# Patient Record
Sex: Female | Born: 1935
Health system: Southern US, Community
[De-identification: ages and names within clinical notes are randomized; demographics above are authoritative.]

## PROBLEM LIST (undated history)

## (undated) DIAGNOSIS — C4492 Squamous cell carcinoma of skin, unspecified: Secondary | ICD-10-CM

## (undated) DIAGNOSIS — C801 Malignant (primary) neoplasm, unspecified: Secondary | ICD-10-CM

## (undated) DIAGNOSIS — C4491 Basal cell carcinoma of skin, unspecified: Secondary | ICD-10-CM

## (undated) DIAGNOSIS — M1990 Unspecified osteoarthritis, unspecified site: Secondary | ICD-10-CM

## (undated) DIAGNOSIS — N809 Endometriosis, unspecified: Secondary | ICD-10-CM

## (undated) DIAGNOSIS — K649 Unspecified hemorrhoids: Secondary | ICD-10-CM

## (undated) DIAGNOSIS — G43909 Migraine, unspecified, not intractable, without status migrainosus: Secondary | ICD-10-CM

## (undated) HISTORY — DX: Basal cell carcinoma of skin, unspecified: C44.91

## (undated) HISTORY — PX: BREAST SURGERY: SHX581

## (undated) HISTORY — PX: LUNG SURGERY: SHX703

## (undated) HISTORY — PX: HEMORRHOIDECTOMY WITH HEMORRHOID BANDING: SHX5633

## (undated) HISTORY — PX: ABDOMINAL HYSTERECTOMY: SHX81

## (undated) HISTORY — DX: Squamous cell carcinoma of skin, unspecified: C44.92

## (undated) HISTORY — PX: JOINT REPLACEMENT: SHX530

## (undated) HISTORY — DX: Endometriosis, unspecified: N80.9

## (undated) HISTORY — PX: OTHER SURGICAL HISTORY: SHX169

---

## 2005-01-12 ENCOUNTER — Ambulatory Visit: Payer: Self-pay | Admitting: Neurology

## 2005-03-29 ENCOUNTER — Ambulatory Visit: Payer: Self-pay | Admitting: Family Medicine

## 2005-10-18 ENCOUNTER — Ambulatory Visit: Payer: Self-pay | Admitting: Neurology

## 2006-09-04 ENCOUNTER — Ambulatory Visit: Payer: Self-pay | Admitting: Gastroenterology

## 2006-09-04 LAB — HM COLONOSCOPY

## 2006-10-09 HISTORY — PX: OTHER SURGICAL HISTORY: SHX169

## 2007-11-20 ENCOUNTER — Other Ambulatory Visit: Payer: Self-pay

## 2007-11-20 ENCOUNTER — Ambulatory Visit: Payer: Self-pay | Admitting: Urology

## 2007-12-11 ENCOUNTER — Ambulatory Visit: Payer: Self-pay | Admitting: Urology

## 2008-01-21 ENCOUNTER — Ambulatory Visit: Payer: Self-pay | Admitting: Neurology

## 2009-03-15 ENCOUNTER — Ambulatory Visit: Payer: Self-pay | Admitting: General Practice

## 2009-03-23 ENCOUNTER — Inpatient Hospital Stay: Payer: Self-pay | Admitting: General Practice

## 2009-09-30 DIAGNOSIS — M8580 Other specified disorders of bone density and structure, unspecified site: Secondary | ICD-10-CM | POA: Insufficient documentation

## 2010-08-04 ENCOUNTER — Ambulatory Visit: Payer: Self-pay

## 2010-12-12 ENCOUNTER — Ambulatory Visit: Payer: Self-pay

## 2011-01-30 ENCOUNTER — Ambulatory Visit: Payer: Self-pay | Admitting: Neurology

## 2011-07-30 ENCOUNTER — Ambulatory Visit: Payer: Self-pay | Admitting: General Practice

## 2011-10-24 DIAGNOSIS — D497 Neoplasm of unspecified behavior of endocrine glands and other parts of nervous system: Secondary | ICD-10-CM | POA: Diagnosis not present

## 2011-10-24 DIAGNOSIS — M4802 Spinal stenosis, cervical region: Secondary | ICD-10-CM | POA: Diagnosis not present

## 2011-12-26 DIAGNOSIS — M169 Osteoarthritis of hip, unspecified: Secondary | ICD-10-CM | POA: Diagnosis not present

## 2012-02-02 DIAGNOSIS — M5137 Other intervertebral disc degeneration, lumbosacral region: Secondary | ICD-10-CM | POA: Diagnosis not present

## 2012-02-02 DIAGNOSIS — IMO0002 Reserved for concepts with insufficient information to code with codable children: Secondary | ICD-10-CM | POA: Diagnosis not present

## 2012-02-12 DIAGNOSIS — Z23 Encounter for immunization: Secondary | ICD-10-CM | POA: Diagnosis not present

## 2012-02-12 DIAGNOSIS — E038 Other specified hypothyroidism: Secondary | ICD-10-CM | POA: Diagnosis not present

## 2012-02-12 DIAGNOSIS — R209 Unspecified disturbances of skin sensation: Secondary | ICD-10-CM | POA: Diagnosis not present

## 2012-03-06 DIAGNOSIS — Z85820 Personal history of malignant melanoma of skin: Secondary | ICD-10-CM | POA: Diagnosis not present

## 2012-03-06 DIAGNOSIS — D2320 Other benign neoplasm of skin of unspecified ear and external auricular canal: Secondary | ICD-10-CM | POA: Diagnosis not present

## 2012-03-06 DIAGNOSIS — D485 Neoplasm of uncertain behavior of skin: Secondary | ICD-10-CM | POA: Diagnosis not present

## 2012-03-12 ENCOUNTER — Ambulatory Visit: Payer: Self-pay | Admitting: General Practice

## 2012-03-12 DIAGNOSIS — M169 Osteoarthritis of hip, unspecified: Secondary | ICD-10-CM | POA: Diagnosis not present

## 2012-03-12 DIAGNOSIS — I499 Cardiac arrhythmia, unspecified: Secondary | ICD-10-CM | POA: Diagnosis not present

## 2012-03-12 DIAGNOSIS — I2510 Atherosclerotic heart disease of native coronary artery without angina pectoris: Secondary | ICD-10-CM | POA: Diagnosis not present

## 2012-03-12 DIAGNOSIS — Z90710 Acquired absence of both cervix and uterus: Secondary | ICD-10-CM | POA: Diagnosis not present

## 2012-03-12 DIAGNOSIS — Z888 Allergy status to other drugs, medicaments and biological substances status: Secondary | ICD-10-CM | POA: Diagnosis not present

## 2012-03-12 DIAGNOSIS — Z8619 Personal history of other infectious and parasitic diseases: Secondary | ICD-10-CM | POA: Diagnosis not present

## 2012-03-12 DIAGNOSIS — Z01812 Encounter for preprocedural laboratory examination: Secondary | ICD-10-CM | POA: Diagnosis not present

## 2012-03-12 DIAGNOSIS — M79609 Pain in unspecified limb: Secondary | ICD-10-CM | POA: Diagnosis not present

## 2012-03-12 DIAGNOSIS — G43909 Migraine, unspecified, not intractable, without status migrainosus: Secondary | ICD-10-CM | POA: Diagnosis not present

## 2012-03-12 DIAGNOSIS — Z79899 Other long term (current) drug therapy: Secondary | ICD-10-CM | POA: Diagnosis not present

## 2012-03-12 DIAGNOSIS — Z01810 Encounter for preprocedural cardiovascular examination: Secondary | ICD-10-CM | POA: Diagnosis not present

## 2012-03-12 LAB — URINALYSIS, COMPLETE
Bilirubin,UR: NEGATIVE
Glucose,UR: NEGATIVE mg/dL (ref 0–75)
Nitrite: NEGATIVE
Ph: 6 (ref 4.5–8.0)
Protein: NEGATIVE
RBC,UR: <1 /HPF (ref 0–5)
Transitional Epi: <1

## 2012-03-12 LAB — BASIC METABOLIC PANEL
Anion Gap: 5 — ABNORMAL LOW (ref 7–16)
BUN: 12 mg/dL (ref 7–18)
Calcium, Total: 9.9 mg/dL (ref 8.5–10.1)
Co2: 31 mmol/L (ref 21–32)
Creatinine: 0.7 mg/dL (ref 0.60–1.30)
EGFR (African American): >60
EGFR (Non-African Amer.): >60
Glucose: 83 mg/dL (ref 65–99)
Osmolality: 269 (ref 275–301)
Potassium: 4.3 mmol/L (ref 3.5–5.1)
Sodium: 135 mmol/L — ABNORMAL LOW (ref 136–145)

## 2012-03-12 LAB — CBC
HGB: 12.3 g/dL (ref 12.0–16.0)
Platelet: 273 10*3/uL (ref 150–440)
RBC: 4.39 10*6/uL (ref 3.80–5.20)
WBC: 7.8 10*3/uL (ref 3.6–11.0)

## 2012-03-12 LAB — PROTIME-INR: Prothrombin Time: 12.9 secs (ref 11.5–14.7)

## 2012-03-12 LAB — SEDIMENTATION RATE: Erythrocyte Sed Rate: 11 mm/hr (ref 0–30)

## 2012-03-12 LAB — APTT: Activated PTT: 30.4 secs (ref 23.6–35.9)

## 2012-03-25 ENCOUNTER — Inpatient Hospital Stay: Payer: Self-pay | Admitting: General Practice

## 2012-03-25 DIAGNOSIS — Z90710 Acquired absence of both cervix and uterus: Secondary | ICD-10-CM | POA: Diagnosis not present

## 2012-03-25 DIAGNOSIS — Z8042 Family history of malignant neoplasm of prostate: Secondary | ICD-10-CM | POA: Diagnosis not present

## 2012-03-25 DIAGNOSIS — Z803 Family history of malignant neoplasm of breast: Secondary | ICD-10-CM | POA: Diagnosis not present

## 2012-03-25 DIAGNOSIS — I2510 Atherosclerotic heart disease of native coronary artery without angina pectoris: Secondary | ICD-10-CM | POA: Diagnosis present

## 2012-03-25 DIAGNOSIS — G43909 Migraine, unspecified, not intractable, without status migrainosus: Secondary | ICD-10-CM | POA: Diagnosis present

## 2012-03-25 DIAGNOSIS — Z9010 Acquired absence of unspecified breast and nipple: Secondary | ICD-10-CM | POA: Diagnosis not present

## 2012-03-25 DIAGNOSIS — Z85820 Personal history of malignant melanoma of skin: Secondary | ICD-10-CM | POA: Diagnosis not present

## 2012-03-25 DIAGNOSIS — Z8249 Family history of ischemic heart disease and other diseases of the circulatory system: Secondary | ICD-10-CM | POA: Diagnosis not present

## 2012-03-25 DIAGNOSIS — M1990 Unspecified osteoarthritis, unspecified site: Secondary | ICD-10-CM | POA: Diagnosis not present

## 2012-03-25 DIAGNOSIS — R011 Cardiac murmur, unspecified: Secondary | ICD-10-CM | POA: Diagnosis present

## 2012-03-25 DIAGNOSIS — M129 Arthropathy, unspecified: Secondary | ICD-10-CM | POA: Diagnosis present

## 2012-03-25 DIAGNOSIS — N6489 Other specified disorders of breast: Secondary | ICD-10-CM | POA: Diagnosis present

## 2012-03-25 DIAGNOSIS — K648 Other hemorrhoids: Secondary | ICD-10-CM | POA: Diagnosis present

## 2012-03-25 DIAGNOSIS — J42 Unspecified chronic bronchitis: Secondary | ICD-10-CM | POA: Diagnosis present

## 2012-03-25 DIAGNOSIS — Z79899 Other long term (current) drug therapy: Secondary | ICD-10-CM | POA: Diagnosis not present

## 2012-03-25 DIAGNOSIS — Z9889 Other specified postprocedural states: Secondary | ICD-10-CM | POA: Diagnosis not present

## 2012-03-25 DIAGNOSIS — Z8262 Family history of osteoporosis: Secondary | ICD-10-CM | POA: Diagnosis not present

## 2012-03-25 DIAGNOSIS — Z96649 Presence of unspecified artificial hip joint: Secondary | ICD-10-CM | POA: Diagnosis not present

## 2012-03-25 DIAGNOSIS — Z471 Aftercare following joint replacement surgery: Secondary | ICD-10-CM | POA: Diagnosis not present

## 2012-03-25 DIAGNOSIS — Z8619 Personal history of other infectious and parasitic diseases: Secondary | ICD-10-CM | POA: Diagnosis not present

## 2012-03-25 DIAGNOSIS — M169 Osteoarthritis of hip, unspecified: Secondary | ICD-10-CM | POA: Diagnosis not present

## 2012-03-25 DIAGNOSIS — Z560 Unemployment, unspecified: Secondary | ICD-10-CM | POA: Diagnosis not present

## 2012-03-25 DIAGNOSIS — Z96659 Presence of unspecified artificial knee joint: Secondary | ICD-10-CM | POA: Diagnosis not present

## 2012-03-25 DIAGNOSIS — Z7982 Long term (current) use of aspirin: Secondary | ICD-10-CM | POA: Diagnosis not present

## 2012-03-25 DIAGNOSIS — Z888 Allergy status to other drugs, medicaments and biological substances status: Secondary | ICD-10-CM | POA: Diagnosis not present

## 2012-03-26 LAB — HEMOGLOBIN: HGB: 8.9 g/dL — ABNORMAL LOW (ref 12.0–16.0)

## 2012-03-26 LAB — BASIC METABOLIC PANEL
Anion Gap: 8 (ref 7–16)
BUN: 8 mg/dL (ref 7–18)
Creatinine: 0.7 mg/dL (ref 0.60–1.30)
EGFR (African American): >60
Glucose: 89 mg/dL (ref 65–99)
Osmolality: 273 (ref 275–301)
Potassium: 4.3 mmol/L (ref 3.5–5.1)

## 2012-03-26 LAB — PLATELET COUNT: Platelet: 195 10*3/uL (ref 150–440)

## 2012-03-27 LAB — PATHOLOGY REPORT

## 2012-03-27 LAB — BASIC METABOLIC PANEL
Calcium, Total: 8.3 mg/dL — ABNORMAL LOW (ref 8.5–10.1)
Chloride: 99 mmol/L (ref 98–107)
Co2: 26 mmol/L (ref 21–32)
Creatinine: 0.5 mg/dL — ABNORMAL LOW (ref 0.60–1.30)
EGFR (African American): >60
EGFR (Non-African Amer.): >60
Osmolality: 265 (ref 275–301)
Sodium: 134 mmol/L — ABNORMAL LOW (ref 136–145)

## 2012-03-29 DIAGNOSIS — Z96649 Presence of unspecified artificial hip joint: Secondary | ICD-10-CM | POA: Diagnosis not present

## 2012-03-29 DIAGNOSIS — Z471 Aftercare following joint replacement surgery: Secondary | ICD-10-CM | POA: Diagnosis not present

## 2012-03-29 DIAGNOSIS — IMO0001 Reserved for inherently not codable concepts without codable children: Secondary | ICD-10-CM | POA: Diagnosis not present

## 2012-03-29 DIAGNOSIS — Z85820 Personal history of malignant melanoma of skin: Secondary | ICD-10-CM | POA: Diagnosis not present

## 2012-03-29 DIAGNOSIS — M545 Low back pain: Secondary | ICD-10-CM | POA: Diagnosis not present

## 2012-03-30 DIAGNOSIS — Z85820 Personal history of malignant melanoma of skin: Secondary | ICD-10-CM | POA: Diagnosis not present

## 2012-03-30 DIAGNOSIS — M545 Low back pain: Secondary | ICD-10-CM | POA: Diagnosis not present

## 2012-03-30 DIAGNOSIS — IMO0001 Reserved for inherently not codable concepts without codable children: Secondary | ICD-10-CM | POA: Diagnosis not present

## 2012-03-30 DIAGNOSIS — Z96649 Presence of unspecified artificial hip joint: Secondary | ICD-10-CM | POA: Diagnosis not present

## 2012-03-30 DIAGNOSIS — Z471 Aftercare following joint replacement surgery: Secondary | ICD-10-CM | POA: Diagnosis not present

## 2012-04-02 DIAGNOSIS — Z471 Aftercare following joint replacement surgery: Secondary | ICD-10-CM | POA: Diagnosis not present

## 2012-04-02 DIAGNOSIS — Z85820 Personal history of malignant melanoma of skin: Secondary | ICD-10-CM | POA: Diagnosis not present

## 2012-04-02 DIAGNOSIS — M545 Low back pain: Secondary | ICD-10-CM | POA: Diagnosis not present

## 2012-04-02 DIAGNOSIS — Z96649 Presence of unspecified artificial hip joint: Secondary | ICD-10-CM | POA: Diagnosis not present

## 2012-04-02 DIAGNOSIS — IMO0001 Reserved for inherently not codable concepts without codable children: Secondary | ICD-10-CM | POA: Diagnosis not present

## 2012-04-04 DIAGNOSIS — Z471 Aftercare following joint replacement surgery: Secondary | ICD-10-CM | POA: Diagnosis not present

## 2012-04-04 DIAGNOSIS — IMO0001 Reserved for inherently not codable concepts without codable children: Secondary | ICD-10-CM | POA: Diagnosis not present

## 2012-04-04 DIAGNOSIS — Z96649 Presence of unspecified artificial hip joint: Secondary | ICD-10-CM | POA: Diagnosis not present

## 2012-04-04 DIAGNOSIS — M545 Low back pain: Secondary | ICD-10-CM | POA: Diagnosis not present

## 2012-04-04 DIAGNOSIS — Z85820 Personal history of malignant melanoma of skin: Secondary | ICD-10-CM | POA: Diagnosis not present

## 2012-04-08 DIAGNOSIS — Z85820 Personal history of malignant melanoma of skin: Secondary | ICD-10-CM | POA: Diagnosis not present

## 2012-04-08 DIAGNOSIS — Z471 Aftercare following joint replacement surgery: Secondary | ICD-10-CM | POA: Diagnosis not present

## 2012-04-08 DIAGNOSIS — Z96649 Presence of unspecified artificial hip joint: Secondary | ICD-10-CM | POA: Diagnosis not present

## 2012-04-08 DIAGNOSIS — IMO0001 Reserved for inherently not codable concepts without codable children: Secondary | ICD-10-CM | POA: Diagnosis not present

## 2012-04-08 DIAGNOSIS — M545 Low back pain: Secondary | ICD-10-CM | POA: Diagnosis not present

## 2012-04-10 DIAGNOSIS — M545 Low back pain: Secondary | ICD-10-CM | POA: Diagnosis not present

## 2012-04-10 DIAGNOSIS — Z85820 Personal history of malignant melanoma of skin: Secondary | ICD-10-CM | POA: Diagnosis not present

## 2012-04-10 DIAGNOSIS — Z96649 Presence of unspecified artificial hip joint: Secondary | ICD-10-CM | POA: Diagnosis not present

## 2012-04-10 DIAGNOSIS — IMO0001 Reserved for inherently not codable concepts without codable children: Secondary | ICD-10-CM | POA: Diagnosis not present

## 2012-04-10 DIAGNOSIS — Z471 Aftercare following joint replacement surgery: Secondary | ICD-10-CM | POA: Diagnosis not present

## 2012-04-15 DIAGNOSIS — Z85820 Personal history of malignant melanoma of skin: Secondary | ICD-10-CM | POA: Diagnosis not present

## 2012-04-15 DIAGNOSIS — Z96649 Presence of unspecified artificial hip joint: Secondary | ICD-10-CM | POA: Diagnosis not present

## 2012-04-15 DIAGNOSIS — Z471 Aftercare following joint replacement surgery: Secondary | ICD-10-CM | POA: Diagnosis not present

## 2012-04-15 DIAGNOSIS — IMO0001 Reserved for inherently not codable concepts without codable children: Secondary | ICD-10-CM | POA: Diagnosis not present

## 2012-04-15 DIAGNOSIS — M545 Low back pain: Secondary | ICD-10-CM | POA: Diagnosis not present

## 2012-04-19 DIAGNOSIS — IMO0001 Reserved for inherently not codable concepts without codable children: Secondary | ICD-10-CM | POA: Diagnosis not present

## 2012-04-19 DIAGNOSIS — Z96649 Presence of unspecified artificial hip joint: Secondary | ICD-10-CM | POA: Diagnosis not present

## 2012-04-19 DIAGNOSIS — Z85820 Personal history of malignant melanoma of skin: Secondary | ICD-10-CM | POA: Diagnosis not present

## 2012-04-19 DIAGNOSIS — M545 Low back pain: Secondary | ICD-10-CM | POA: Diagnosis not present

## 2012-04-19 DIAGNOSIS — Z471 Aftercare following joint replacement surgery: Secondary | ICD-10-CM | POA: Diagnosis not present

## 2012-04-21 DIAGNOSIS — Z96649 Presence of unspecified artificial hip joint: Secondary | ICD-10-CM | POA: Diagnosis not present

## 2012-04-21 DIAGNOSIS — M545 Low back pain: Secondary | ICD-10-CM | POA: Diagnosis not present

## 2012-04-21 DIAGNOSIS — Z85820 Personal history of malignant melanoma of skin: Secondary | ICD-10-CM | POA: Diagnosis not present

## 2012-04-21 DIAGNOSIS — IMO0001 Reserved for inherently not codable concepts without codable children: Secondary | ICD-10-CM | POA: Diagnosis not present

## 2012-04-21 DIAGNOSIS — Z471 Aftercare following joint replacement surgery: Secondary | ICD-10-CM | POA: Diagnosis not present

## 2012-04-25 DIAGNOSIS — C4441 Basal cell carcinoma of skin of scalp and neck: Secondary | ICD-10-CM | POA: Diagnosis not present

## 2012-04-25 DIAGNOSIS — D2320 Other benign neoplasm of skin of unspecified ear and external auricular canal: Secondary | ICD-10-CM | POA: Diagnosis not present

## 2012-04-29 DIAGNOSIS — IMO0001 Reserved for inherently not codable concepts without codable children: Secondary | ICD-10-CM | POA: Diagnosis not present

## 2012-04-29 DIAGNOSIS — Z96649 Presence of unspecified artificial hip joint: Secondary | ICD-10-CM | POA: Diagnosis not present

## 2012-04-29 DIAGNOSIS — M545 Low back pain: Secondary | ICD-10-CM | POA: Diagnosis not present

## 2012-04-29 DIAGNOSIS — Z471 Aftercare following joint replacement surgery: Secondary | ICD-10-CM | POA: Diagnosis not present

## 2012-04-29 DIAGNOSIS — Z85820 Personal history of malignant melanoma of skin: Secondary | ICD-10-CM | POA: Diagnosis not present

## 2012-05-02 DIAGNOSIS — Z96649 Presence of unspecified artificial hip joint: Secondary | ICD-10-CM | POA: Diagnosis not present

## 2012-05-02 DIAGNOSIS — M545 Low back pain: Secondary | ICD-10-CM | POA: Diagnosis not present

## 2012-05-02 DIAGNOSIS — Z471 Aftercare following joint replacement surgery: Secondary | ICD-10-CM | POA: Diagnosis not present

## 2012-05-02 DIAGNOSIS — Z85820 Personal history of malignant melanoma of skin: Secondary | ICD-10-CM | POA: Diagnosis not present

## 2012-05-02 DIAGNOSIS — IMO0001 Reserved for inherently not codable concepts without codable children: Secondary | ICD-10-CM | POA: Diagnosis not present

## 2012-05-07 DIAGNOSIS — Z96649 Presence of unspecified artificial hip joint: Secondary | ICD-10-CM | POA: Diagnosis not present

## 2012-05-08 DIAGNOSIS — Z85820 Personal history of malignant melanoma of skin: Secondary | ICD-10-CM | POA: Diagnosis not present

## 2012-05-08 DIAGNOSIS — Z96649 Presence of unspecified artificial hip joint: Secondary | ICD-10-CM | POA: Diagnosis not present

## 2012-05-08 DIAGNOSIS — IMO0001 Reserved for inherently not codable concepts without codable children: Secondary | ICD-10-CM | POA: Diagnosis not present

## 2012-05-08 DIAGNOSIS — M545 Low back pain: Secondary | ICD-10-CM | POA: Diagnosis not present

## 2012-05-08 DIAGNOSIS — Z471 Aftercare following joint replacement surgery: Secondary | ICD-10-CM | POA: Diagnosis not present

## 2012-07-31 DIAGNOSIS — D2320 Other benign neoplasm of skin of unspecified ear and external auricular canal: Secondary | ICD-10-CM | POA: Diagnosis not present

## 2012-07-31 DIAGNOSIS — Z85828 Personal history of other malignant neoplasm of skin: Secondary | ICD-10-CM | POA: Diagnosis not present

## 2012-07-31 DIAGNOSIS — D485 Neoplasm of uncertain behavior of skin: Secondary | ICD-10-CM | POA: Diagnosis not present

## 2012-07-31 DIAGNOSIS — L570 Actinic keratosis: Secondary | ICD-10-CM | POA: Diagnosis not present

## 2012-08-26 DIAGNOSIS — H2510 Age-related nuclear cataract, unspecified eye: Secondary | ICD-10-CM | POA: Diagnosis not present

## 2012-08-26 DIAGNOSIS — H524 Presbyopia: Secondary | ICD-10-CM | POA: Diagnosis not present

## 2012-08-27 DIAGNOSIS — R209 Unspecified disturbances of skin sensation: Secondary | ICD-10-CM | POA: Diagnosis not present

## 2012-08-27 DIAGNOSIS — E038 Other specified hypothyroidism: Secondary | ICD-10-CM | POA: Diagnosis not present

## 2012-08-27 DIAGNOSIS — Z23 Encounter for immunization: Secondary | ICD-10-CM | POA: Diagnosis not present

## 2012-08-29 DIAGNOSIS — C4441 Basal cell carcinoma of skin of scalp and neck: Secondary | ICD-10-CM | POA: Diagnosis not present

## 2012-08-29 DIAGNOSIS — D2320 Other benign neoplasm of skin of unspecified ear and external auricular canal: Secondary | ICD-10-CM | POA: Diagnosis not present

## 2012-10-21 DIAGNOSIS — G959 Disease of spinal cord, unspecified: Secondary | ICD-10-CM | POA: Diagnosis not present

## 2012-10-21 DIAGNOSIS — M47812 Spondylosis without myelopathy or radiculopathy, cervical region: Secondary | ICD-10-CM | POA: Diagnosis not present

## 2012-10-21 DIAGNOSIS — M5020 Other cervical disc displacement, unspecified cervical region: Secondary | ICD-10-CM | POA: Diagnosis not present

## 2012-10-21 DIAGNOSIS — D497 Neoplasm of unspecified behavior of endocrine glands and other parts of nervous system: Secondary | ICD-10-CM | POA: Diagnosis not present

## 2012-10-22 DIAGNOSIS — M6580 Other synovitis and tenosynovitis, unspecified site: Secondary | ICD-10-CM | POA: Diagnosis not present

## 2012-11-28 DIAGNOSIS — D485 Neoplasm of uncertain behavior of skin: Secondary | ICD-10-CM | POA: Diagnosis not present

## 2012-11-28 DIAGNOSIS — Z85828 Personal history of other malignant neoplasm of skin: Secondary | ICD-10-CM | POA: Diagnosis not present

## 2012-11-28 DIAGNOSIS — C44319 Basal cell carcinoma of skin of other parts of face: Secondary | ICD-10-CM | POA: Diagnosis not present

## 2012-12-25 DIAGNOSIS — C44319 Basal cell carcinoma of skin of other parts of face: Secondary | ICD-10-CM | POA: Diagnosis not present

## 2012-12-26 ENCOUNTER — Ambulatory Visit: Payer: Self-pay | Admitting: Family Medicine

## 2012-12-26 DIAGNOSIS — R209 Unspecified disturbances of skin sensation: Secondary | ICD-10-CM | POA: Diagnosis not present

## 2012-12-26 DIAGNOSIS — J90 Pleural effusion, not elsewhere classified: Secondary | ICD-10-CM | POA: Diagnosis not present

## 2012-12-26 DIAGNOSIS — Z23 Encounter for immunization: Secondary | ICD-10-CM | POA: Diagnosis not present

## 2012-12-26 DIAGNOSIS — J189 Pneumonia, unspecified organism: Secondary | ICD-10-CM | POA: Diagnosis not present

## 2012-12-26 DIAGNOSIS — R918 Other nonspecific abnormal finding of lung field: Secondary | ICD-10-CM | POA: Diagnosis not present

## 2012-12-26 DIAGNOSIS — E038 Other specified hypothyroidism: Secondary | ICD-10-CM | POA: Diagnosis not present

## 2012-12-28 DIAGNOSIS — E038 Other specified hypothyroidism: Secondary | ICD-10-CM | POA: Diagnosis not present

## 2012-12-28 DIAGNOSIS — R209 Unspecified disturbances of skin sensation: Secondary | ICD-10-CM | POA: Diagnosis not present

## 2012-12-28 DIAGNOSIS — J189 Pneumonia, unspecified organism: Secondary | ICD-10-CM | POA: Diagnosis not present

## 2012-12-28 DIAGNOSIS — Z23 Encounter for immunization: Secondary | ICD-10-CM | POA: Diagnosis not present

## 2013-01-14 ENCOUNTER — Ambulatory Visit: Payer: Self-pay | Admitting: Family Medicine

## 2013-01-14 DIAGNOSIS — R05 Cough: Secondary | ICD-10-CM | POA: Diagnosis not present

## 2013-01-14 DIAGNOSIS — R059 Cough, unspecified: Secondary | ICD-10-CM | POA: Diagnosis not present

## 2013-01-14 DIAGNOSIS — R918 Other nonspecific abnormal finding of lung field: Secondary | ICD-10-CM | POA: Diagnosis not present

## 2013-01-17 ENCOUNTER — Ambulatory Visit: Payer: Self-pay | Admitting: Family Medicine

## 2013-01-17 DIAGNOSIS — R059 Cough, unspecified: Secondary | ICD-10-CM | POA: Diagnosis not present

## 2013-01-17 DIAGNOSIS — R911 Solitary pulmonary nodule: Secondary | ICD-10-CM | POA: Diagnosis not present

## 2013-01-17 DIAGNOSIS — R05 Cough: Secondary | ICD-10-CM | POA: Diagnosis not present

## 2013-01-20 DIAGNOSIS — J309 Allergic rhinitis, unspecified: Secondary | ICD-10-CM | POA: Diagnosis not present

## 2013-01-20 DIAGNOSIS — E038 Other specified hypothyroidism: Secondary | ICD-10-CM | POA: Diagnosis not present

## 2013-01-20 DIAGNOSIS — J209 Acute bronchitis, unspecified: Secondary | ICD-10-CM | POA: Diagnosis not present

## 2013-01-20 DIAGNOSIS — R911 Solitary pulmonary nodule: Secondary | ICD-10-CM | POA: Diagnosis not present

## 2013-02-21 ENCOUNTER — Ambulatory Visit: Payer: Self-pay | Admitting: Family Medicine

## 2013-02-21 DIAGNOSIS — S2249XA Multiple fractures of ribs, unspecified side, initial encounter for closed fracture: Secondary | ICD-10-CM | POA: Diagnosis not present

## 2013-02-21 DIAGNOSIS — J309 Allergic rhinitis, unspecified: Secondary | ICD-10-CM | POA: Diagnosis not present

## 2013-02-21 DIAGNOSIS — J00 Acute nasopharyngitis [common cold]: Secondary | ICD-10-CM | POA: Diagnosis not present

## 2013-02-21 DIAGNOSIS — E038 Other specified hypothyroidism: Secondary | ICD-10-CM | POA: Diagnosis not present

## 2013-02-21 DIAGNOSIS — R079 Chest pain, unspecified: Secondary | ICD-10-CM | POA: Diagnosis not present

## 2013-03-27 DIAGNOSIS — D1801 Hemangioma of skin and subcutaneous tissue: Secondary | ICD-10-CM | POA: Diagnosis not present

## 2013-03-27 DIAGNOSIS — L821 Other seborrheic keratosis: Secondary | ICD-10-CM | POA: Diagnosis not present

## 2013-03-27 DIAGNOSIS — B009 Herpesviral infection, unspecified: Secondary | ICD-10-CM | POA: Diagnosis not present

## 2013-03-27 DIAGNOSIS — Z85820 Personal history of malignant melanoma of skin: Secondary | ICD-10-CM | POA: Diagnosis not present

## 2013-06-28 DIAGNOSIS — J309 Allergic rhinitis, unspecified: Secondary | ICD-10-CM | POA: Diagnosis not present

## 2013-06-28 DIAGNOSIS — J00 Acute nasopharyngitis [common cold]: Secondary | ICD-10-CM | POA: Diagnosis not present

## 2013-06-28 DIAGNOSIS — R079 Chest pain, unspecified: Secondary | ICD-10-CM | POA: Diagnosis not present

## 2013-06-28 DIAGNOSIS — Z23 Encounter for immunization: Secondary | ICD-10-CM | POA: Diagnosis not present

## 2013-06-28 DIAGNOSIS — E038 Other specified hypothyroidism: Secondary | ICD-10-CM | POA: Diagnosis not present

## 2013-07-10 ENCOUNTER — Ambulatory Visit: Payer: Self-pay | Admitting: Family Medicine

## 2013-07-10 DIAGNOSIS — R911 Solitary pulmonary nodule: Secondary | ICD-10-CM | POA: Diagnosis not present

## 2013-07-29 DIAGNOSIS — M659 Synovitis and tenosynovitis, unspecified: Secondary | ICD-10-CM | POA: Diagnosis not present

## 2013-07-30 DIAGNOSIS — M25659 Stiffness of unspecified hip, not elsewhere classified: Secondary | ICD-10-CM | POA: Diagnosis not present

## 2013-07-30 DIAGNOSIS — M6281 Muscle weakness (generalized): Secondary | ICD-10-CM | POA: Diagnosis not present

## 2013-07-30 DIAGNOSIS — M25559 Pain in unspecified hip: Secondary | ICD-10-CM | POA: Diagnosis not present

## 2013-08-04 DIAGNOSIS — M25559 Pain in unspecified hip: Secondary | ICD-10-CM | POA: Diagnosis not present

## 2013-08-04 DIAGNOSIS — M25659 Stiffness of unspecified hip, not elsewhere classified: Secondary | ICD-10-CM | POA: Diagnosis not present

## 2013-08-04 DIAGNOSIS — M6281 Muscle weakness (generalized): Secondary | ICD-10-CM | POA: Diagnosis not present

## 2013-08-06 DIAGNOSIS — H524 Presbyopia: Secondary | ICD-10-CM | POA: Diagnosis not present

## 2013-08-06 DIAGNOSIS — H3540 Unspecified peripheral retinal degeneration: Secondary | ICD-10-CM | POA: Diagnosis not present

## 2013-08-06 DIAGNOSIS — H2510 Age-related nuclear cataract, unspecified eye: Secondary | ICD-10-CM | POA: Diagnosis not present

## 2013-08-07 DIAGNOSIS — M25559 Pain in unspecified hip: Secondary | ICD-10-CM | POA: Diagnosis not present

## 2013-08-07 DIAGNOSIS — M25659 Stiffness of unspecified hip, not elsewhere classified: Secondary | ICD-10-CM | POA: Diagnosis not present

## 2013-08-07 DIAGNOSIS — M6281 Muscle weakness (generalized): Secondary | ICD-10-CM | POA: Diagnosis not present

## 2013-08-11 DIAGNOSIS — M25659 Stiffness of unspecified hip, not elsewhere classified: Secondary | ICD-10-CM | POA: Diagnosis not present

## 2013-08-11 DIAGNOSIS — M25559 Pain in unspecified hip: Secondary | ICD-10-CM | POA: Diagnosis not present

## 2013-08-13 DIAGNOSIS — M6281 Muscle weakness (generalized): Secondary | ICD-10-CM | POA: Diagnosis not present

## 2013-08-13 DIAGNOSIS — M25659 Stiffness of unspecified hip, not elsewhere classified: Secondary | ICD-10-CM | POA: Diagnosis not present

## 2013-08-13 DIAGNOSIS — M25559 Pain in unspecified hip: Secondary | ICD-10-CM | POA: Diagnosis not present

## 2013-09-30 DIAGNOSIS — Z23 Encounter for immunization: Secondary | ICD-10-CM | POA: Diagnosis not present

## 2013-09-30 DIAGNOSIS — Z1331 Encounter for screening for depression: Secondary | ICD-10-CM | POA: Diagnosis not present

## 2013-09-30 DIAGNOSIS — Z1330 Encounter for screening examination for mental health and behavioral disorders, unspecified: Secondary | ICD-10-CM | POA: Diagnosis not present

## 2013-09-30 DIAGNOSIS — J209 Acute bronchitis, unspecified: Secondary | ICD-10-CM | POA: Diagnosis not present

## 2013-09-30 DIAGNOSIS — E038 Other specified hypothyroidism: Secondary | ICD-10-CM | POA: Diagnosis not present

## 2013-12-09 DIAGNOSIS — M545 Low back pain, unspecified: Secondary | ICD-10-CM | POA: Diagnosis not present

## 2014-01-20 DIAGNOSIS — Z1342 Encounter for screening for global developmental delays (milestones): Secondary | ICD-10-CM | POA: Diagnosis not present

## 2014-01-20 DIAGNOSIS — N3000 Acute cystitis without hematuria: Secondary | ICD-10-CM | POA: Diagnosis not present

## 2014-01-20 DIAGNOSIS — N3090 Cystitis, unspecified without hematuria: Secondary | ICD-10-CM | POA: Diagnosis not present

## 2014-01-20 DIAGNOSIS — Z23 Encounter for immunization: Secondary | ICD-10-CM | POA: Diagnosis not present

## 2014-01-20 DIAGNOSIS — Z1330 Encounter for screening examination for mental health and behavioral disorders, unspecified: Secondary | ICD-10-CM | POA: Diagnosis not present

## 2014-01-20 DIAGNOSIS — E038 Other specified hypothyroidism: Secondary | ICD-10-CM | POA: Diagnosis not present

## 2014-01-20 DIAGNOSIS — Z1331 Encounter for screening for depression: Secondary | ICD-10-CM | POA: Diagnosis not present

## 2014-03-11 DIAGNOSIS — Z1330 Encounter for screening examination for mental health and behavioral disorders, unspecified: Secondary | ICD-10-CM | POA: Diagnosis not present

## 2014-03-11 DIAGNOSIS — J069 Acute upper respiratory infection, unspecified: Secondary | ICD-10-CM | POA: Diagnosis not present

## 2014-03-11 DIAGNOSIS — Z1331 Encounter for screening for depression: Secondary | ICD-10-CM | POA: Diagnosis not present

## 2014-03-11 DIAGNOSIS — E038 Other specified hypothyroidism: Secondary | ICD-10-CM | POA: Diagnosis not present

## 2014-03-11 DIAGNOSIS — Z23 Encounter for immunization: Secondary | ICD-10-CM | POA: Diagnosis not present

## 2014-03-11 DIAGNOSIS — Z1342 Encounter for screening for global developmental delays (milestones): Secondary | ICD-10-CM | POA: Diagnosis not present

## 2014-04-08 DIAGNOSIS — H2510 Age-related nuclear cataract, unspecified eye: Secondary | ICD-10-CM | POA: Diagnosis not present

## 2014-04-08 DIAGNOSIS — H00029 Hordeolum internum unspecified eye, unspecified eyelid: Secondary | ICD-10-CM | POA: Diagnosis not present

## 2014-04-08 DIAGNOSIS — H524 Presbyopia: Secondary | ICD-10-CM | POA: Diagnosis not present

## 2014-05-07 DIAGNOSIS — D235 Other benign neoplasm of skin of trunk: Secondary | ICD-10-CM | POA: Diagnosis not present

## 2014-05-07 DIAGNOSIS — L821 Other seborrheic keratosis: Secondary | ICD-10-CM | POA: Diagnosis not present

## 2014-05-07 DIAGNOSIS — D1801 Hemangioma of skin and subcutaneous tissue: Secondary | ICD-10-CM | POA: Diagnosis not present

## 2014-05-07 DIAGNOSIS — D485 Neoplasm of uncertain behavior of skin: Secondary | ICD-10-CM | POA: Diagnosis not present

## 2014-05-07 DIAGNOSIS — Z85820 Personal history of malignant melanoma of skin: Secondary | ICD-10-CM | POA: Diagnosis not present

## 2014-05-07 DIAGNOSIS — D2360 Other benign neoplasm of skin of unspecified upper limb, including shoulder: Secondary | ICD-10-CM | POA: Diagnosis not present

## 2014-05-08 DIAGNOSIS — M25569 Pain in unspecified knee: Secondary | ICD-10-CM | POA: Diagnosis not present

## 2014-05-08 DIAGNOSIS — IMO0002 Reserved for concepts with insufficient information to code with codable children: Secondary | ICD-10-CM | POA: Diagnosis not present

## 2014-05-18 DIAGNOSIS — Z23 Encounter for immunization: Secondary | ICD-10-CM | POA: Diagnosis not present

## 2014-05-18 DIAGNOSIS — N644 Mastodynia: Secondary | ICD-10-CM | POA: Diagnosis not present

## 2014-05-18 DIAGNOSIS — Z978 Presence of other specified devices: Secondary | ICD-10-CM | POA: Diagnosis not present

## 2014-05-18 DIAGNOSIS — E038 Other specified hypothyroidism: Secondary | ICD-10-CM | POA: Diagnosis not present

## 2014-05-18 DIAGNOSIS — Z1342 Encounter for screening for global developmental delays (milestones): Secondary | ICD-10-CM | POA: Diagnosis not present

## 2014-05-18 DIAGNOSIS — Z1330 Encounter for screening examination for mental health and behavioral disorders, unspecified: Secondary | ICD-10-CM | POA: Diagnosis not present

## 2014-05-26 DIAGNOSIS — N644 Mastodynia: Secondary | ICD-10-CM | POA: Diagnosis not present

## 2014-05-26 LAB — HM MAMMOGRAPHY

## 2014-06-02 DIAGNOSIS — L570 Actinic keratosis: Secondary | ICD-10-CM | POA: Diagnosis not present

## 2014-07-08 DIAGNOSIS — L570 Actinic keratosis: Secondary | ICD-10-CM | POA: Diagnosis not present

## 2014-07-10 DIAGNOSIS — H02839 Dermatochalasis of unspecified eye, unspecified eyelid: Secondary | ICD-10-CM | POA: Diagnosis not present

## 2014-07-10 DIAGNOSIS — H18411 Arcus senilis, right eye: Secondary | ICD-10-CM | POA: Diagnosis not present

## 2014-07-10 DIAGNOSIS — H2511 Age-related nuclear cataract, right eye: Secondary | ICD-10-CM | POA: Diagnosis not present

## 2014-07-10 DIAGNOSIS — H25011 Cortical age-related cataract, right eye: Secondary | ICD-10-CM | POA: Diagnosis not present

## 2014-07-14 DIAGNOSIS — Z23 Encounter for immunization: Secondary | ICD-10-CM | POA: Diagnosis not present

## 2014-07-17 DIAGNOSIS — H2511 Age-related nuclear cataract, right eye: Secondary | ICD-10-CM | POA: Diagnosis not present

## 2014-07-17 DIAGNOSIS — H2512 Age-related nuclear cataract, left eye: Secondary | ICD-10-CM | POA: Diagnosis not present

## 2014-07-17 DIAGNOSIS — H269 Unspecified cataract: Secondary | ICD-10-CM | POA: Diagnosis not present

## 2014-07-17 DIAGNOSIS — H2513 Age-related nuclear cataract, bilateral: Secondary | ICD-10-CM | POA: Diagnosis not present

## 2014-07-17 DIAGNOSIS — Z961 Presence of intraocular lens: Secondary | ICD-10-CM | POA: Diagnosis not present

## 2014-08-10 DIAGNOSIS — H2512 Age-related nuclear cataract, left eye: Secondary | ICD-10-CM | POA: Diagnosis not present

## 2014-08-10 DIAGNOSIS — Z961 Presence of intraocular lens: Secondary | ICD-10-CM | POA: Diagnosis not present

## 2014-08-10 DIAGNOSIS — H269 Unspecified cataract: Secondary | ICD-10-CM | POA: Diagnosis not present

## 2014-08-26 DIAGNOSIS — Z0000 Encounter for general adult medical examination without abnormal findings: Secondary | ICD-10-CM | POA: Diagnosis not present

## 2014-08-26 DIAGNOSIS — Z1389 Encounter for screening for other disorder: Secondary | ICD-10-CM | POA: Diagnosis not present

## 2014-08-26 DIAGNOSIS — Z23 Encounter for immunization: Secondary | ICD-10-CM | POA: Diagnosis not present

## 2014-08-31 DIAGNOSIS — E780 Pure hypercholesterolemia: Secondary | ICD-10-CM | POA: Diagnosis not present

## 2014-08-31 DIAGNOSIS — M810 Age-related osteoporosis without current pathological fracture: Secondary | ICD-10-CM | POA: Diagnosis not present

## 2014-08-31 DIAGNOSIS — E038 Other specified hypothyroidism: Secondary | ICD-10-CM | POA: Diagnosis not present

## 2014-08-31 LAB — CBC AND DIFFERENTIAL
HCT: 42 % (ref 36–46)
HEMOGLOBIN: 13.5 g/dL (ref 12.0–16.0)
PLATELETS: 290 10*3/uL (ref 150–399)
WBC: 5.5 10*3/mL

## 2014-08-31 LAB — HEPATIC FUNCTION PANEL
ALT: 11 U/L (ref 7–35)
AST: 17 U/L (ref 13–35)

## 2014-08-31 LAB — LIPID PANEL
CHOLESTEROL: 216 mg/dL — AB (ref 0–200)
HDL: 62 mg/dL (ref 35–70)
LDL Cholesterol: 131 mg/dL
Triglycerides: 114 mg/dL (ref 40–160)

## 2014-08-31 LAB — BASIC METABOLIC PANEL
BUN: 11 mg/dL (ref 4–21)
CREATININE: 0.8 mg/dL (ref 0.5–1.1)
Glucose: 103 mg/dL
POTASSIUM: 5.1 mmol/L (ref 3.4–5.3)
Sodium: 142 mmol/L (ref 137–147)

## 2014-08-31 LAB — TSH: TSH: 6.13 u[IU]/mL — AB (ref 0.41–5.90)

## 2014-10-13 DIAGNOSIS — M189 Osteoarthritis of first carpometacarpal joint, unspecified: Secondary | ICD-10-CM | POA: Insufficient documentation

## 2014-10-13 DIAGNOSIS — M1811 Unilateral primary osteoarthritis of first carpometacarpal joint, right hand: Secondary | ICD-10-CM | POA: Diagnosis not present

## 2014-10-13 DIAGNOSIS — M25531 Pain in right wrist: Secondary | ICD-10-CM | POA: Diagnosis not present

## 2014-10-14 ENCOUNTER — Ambulatory Visit: Payer: Self-pay | Admitting: Family Medicine

## 2014-10-14 DIAGNOSIS — M8589 Other specified disorders of bone density and structure, multiple sites: Secondary | ICD-10-CM | POA: Diagnosis not present

## 2014-10-14 DIAGNOSIS — Z13820 Encounter for screening for osteoporosis: Secondary | ICD-10-CM | POA: Diagnosis not present

## 2014-10-14 DIAGNOSIS — M8580 Other specified disorders of bone density and structure, unspecified site: Secondary | ICD-10-CM | POA: Diagnosis not present

## 2014-10-14 LAB — HM DEXA SCAN

## 2014-11-30 DIAGNOSIS — G5602 Carpal tunnel syndrome, left upper limb: Secondary | ICD-10-CM | POA: Diagnosis not present

## 2014-11-30 DIAGNOSIS — G5601 Carpal tunnel syndrome, right upper limb: Secondary | ICD-10-CM | POA: Diagnosis not present

## 2014-11-30 DIAGNOSIS — M1811 Unilateral primary osteoarthritis of first carpometacarpal joint, right hand: Secondary | ICD-10-CM | POA: Diagnosis not present

## 2014-11-30 DIAGNOSIS — M79641 Pain in right hand: Secondary | ICD-10-CM | POA: Diagnosis not present

## 2014-12-11 DIAGNOSIS — G5602 Carpal tunnel syndrome, left upper limb: Secondary | ICD-10-CM | POA: Diagnosis not present

## 2014-12-11 DIAGNOSIS — G5601 Carpal tunnel syndrome, right upper limb: Secondary | ICD-10-CM | POA: Diagnosis not present

## 2014-12-23 DIAGNOSIS — K5900 Constipation, unspecified: Secondary | ICD-10-CM | POA: Diagnosis not present

## 2014-12-23 DIAGNOSIS — K5730 Diverticulosis of large intestine without perforation or abscess without bleeding: Secondary | ICD-10-CM | POA: Diagnosis not present

## 2014-12-23 DIAGNOSIS — D125 Benign neoplasm of sigmoid colon: Secondary | ICD-10-CM | POA: Diagnosis not present

## 2014-12-25 IMAGING — CT CT CHEST W/ CM
1 series · 15 of 32 positions shown, 19 images · IV contrast (isovue)
Comparison: none

REASON FOR EXAM: persistant cough  abnormal chest XR
COMMENTS:

PROCEDURE:     KCT - KCT CHEST WITH CONTRAST  - January 17, 2013  [DATE]
RESULT:     Comparison: CT of the chest 01/30/2011 and 01/21/2008, chest
radiograph 01/14/2013
TECHNIQUE: Multiple axial images of the chest were obtained with 75 mL
Isovue 300 intravenous contrast.

[Series 2: chest w/ 3.0 i31f 2 · axial · 0.64mm/px · z∈[-861,-579]mm · 15 of 106 slices shown, 19 images]
[im 8/106  mediastinal]
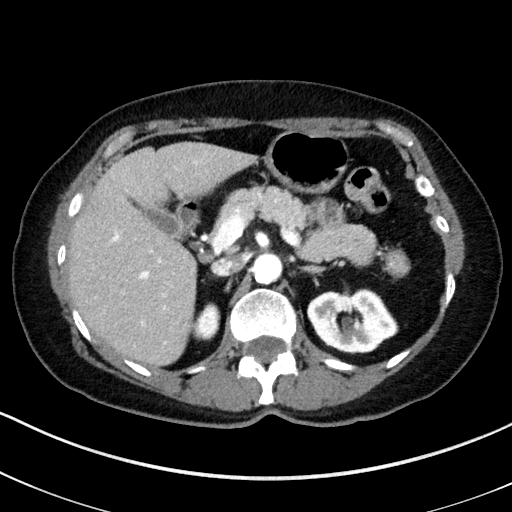
[im 8/106  lung]
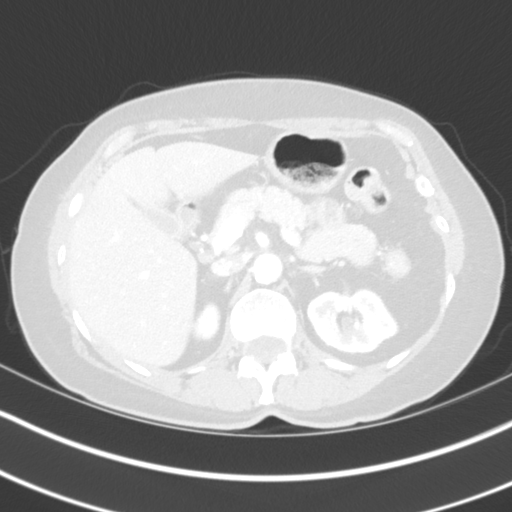
[im 16/106  lung]
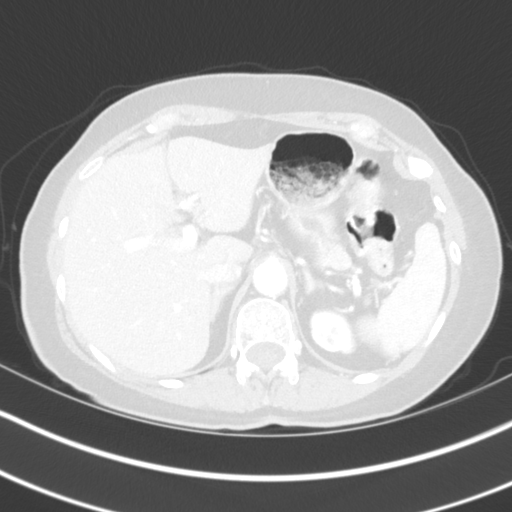
[im 22/106  lung]
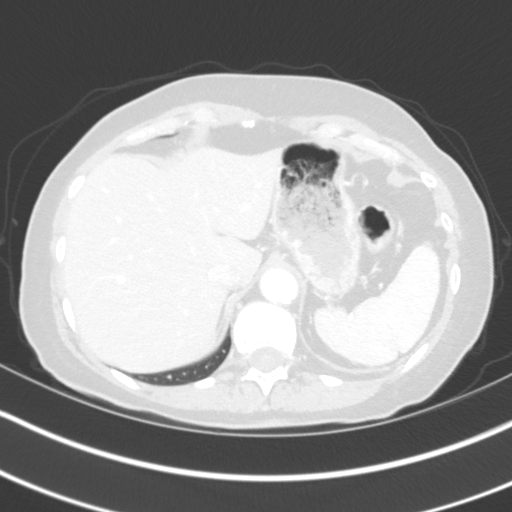
[im 28/106  lung]
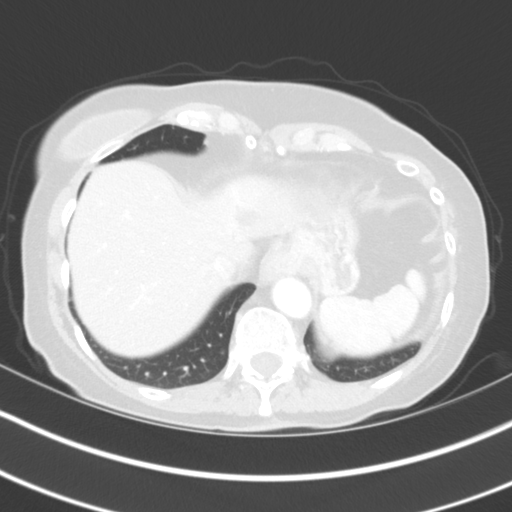
[im 36/106  mediastinal]
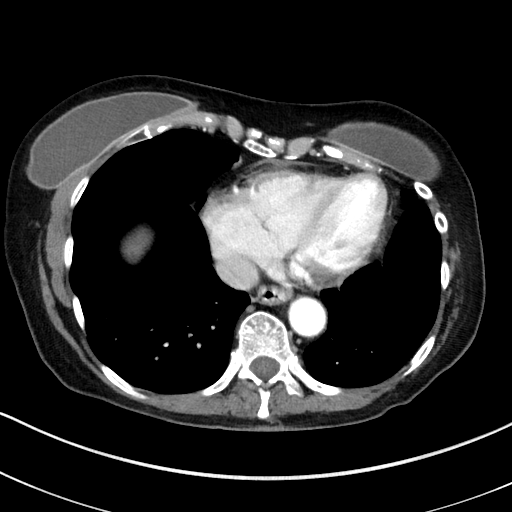
[im 36/106  lung]
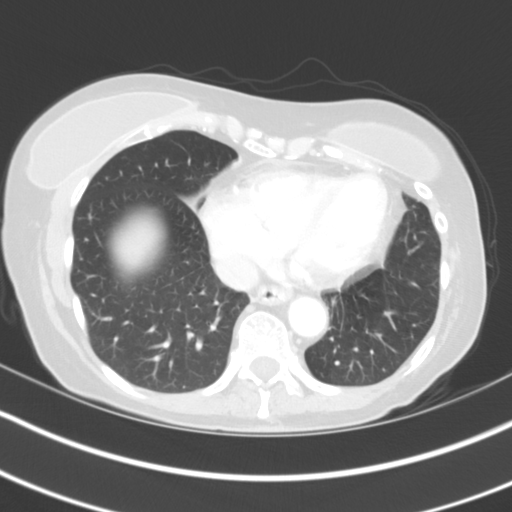
[im 43/106  lung]
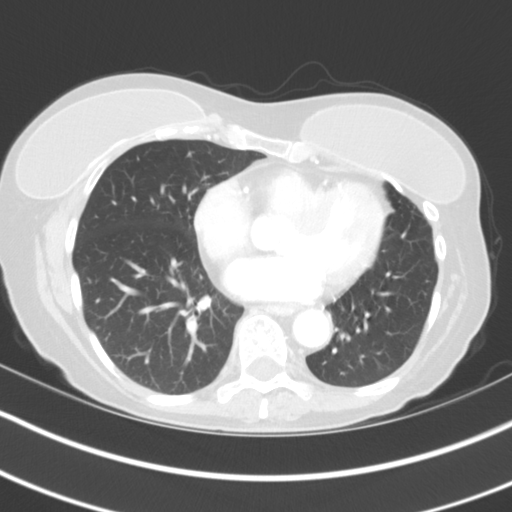
[im 51/106  lung]
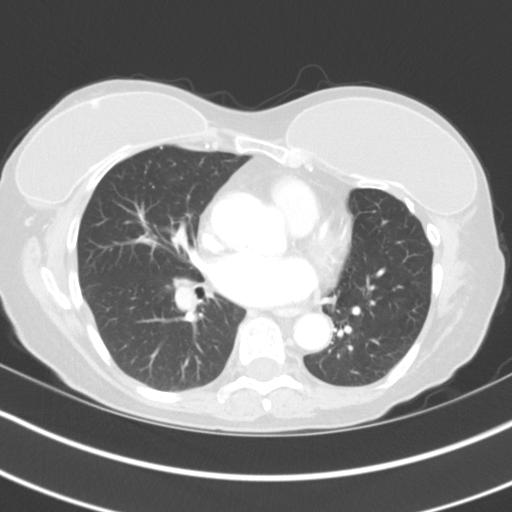
[im 56/106  lung]
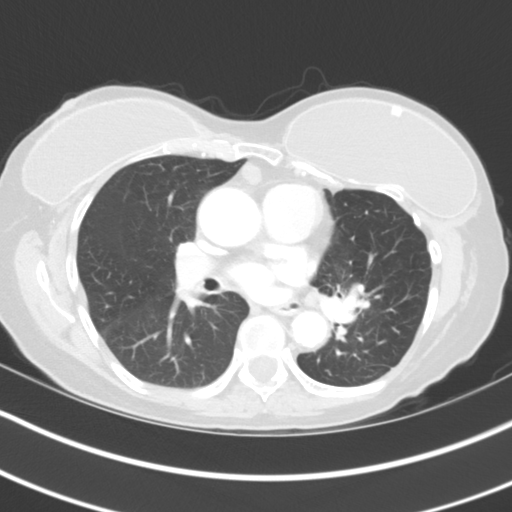
[im 63/106  mediastinal]
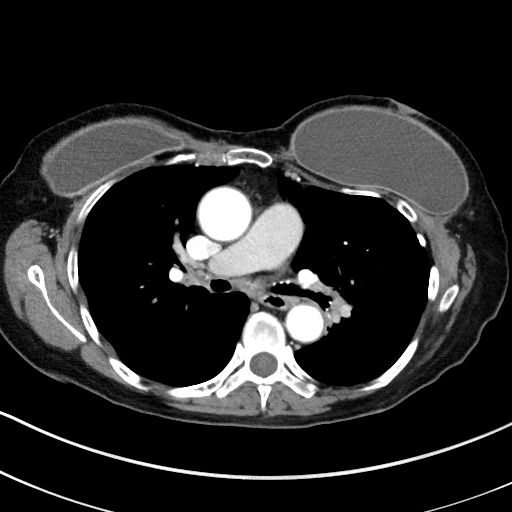
[im 63/106  lung]
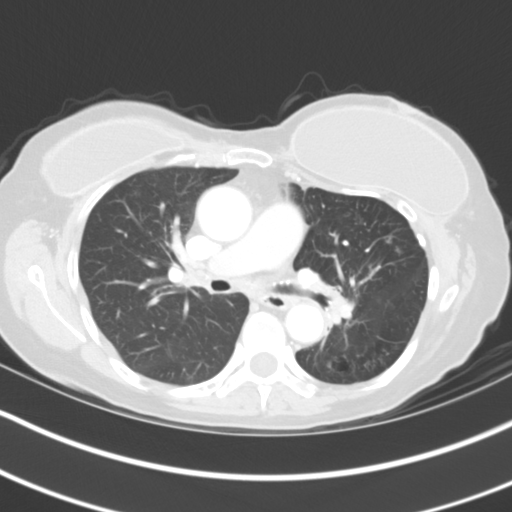
[im 67/106  lung]
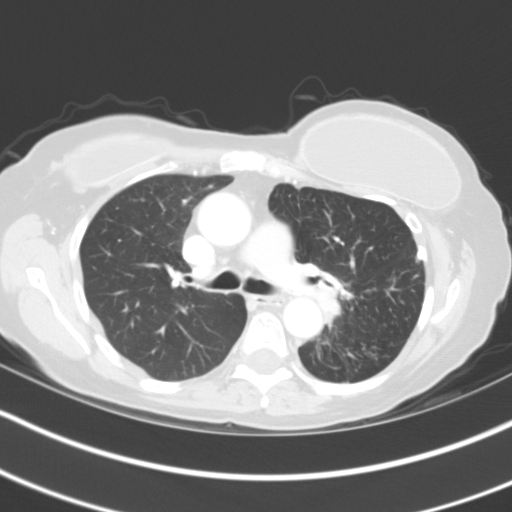
[im 74/106  lung]
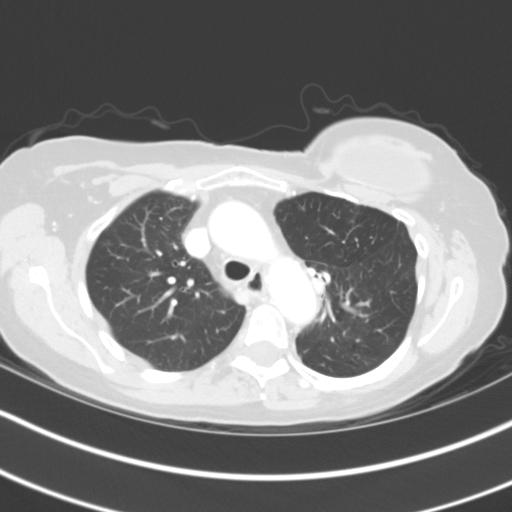
[im 82/106  lung]
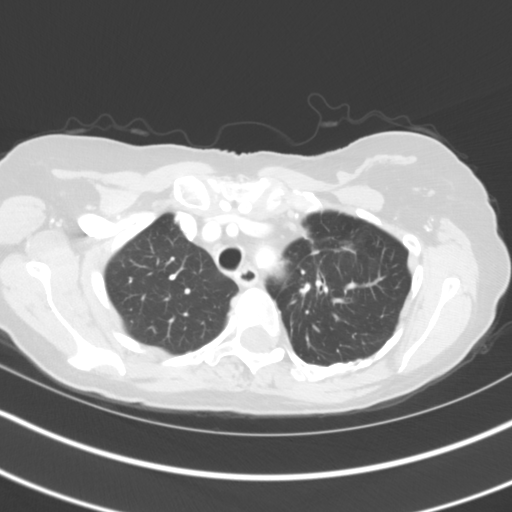
[im 86/106  mediastinal]
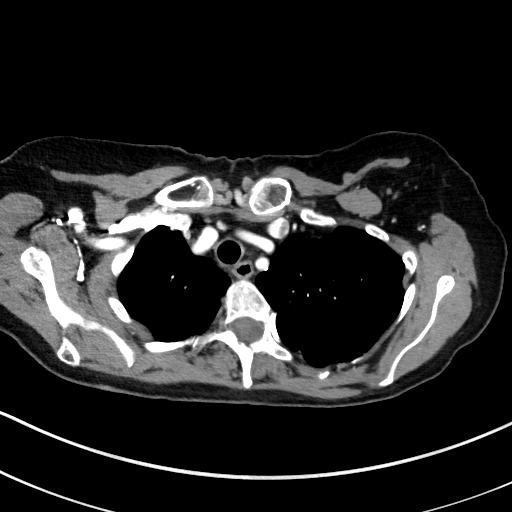
[im 86/106  lung]
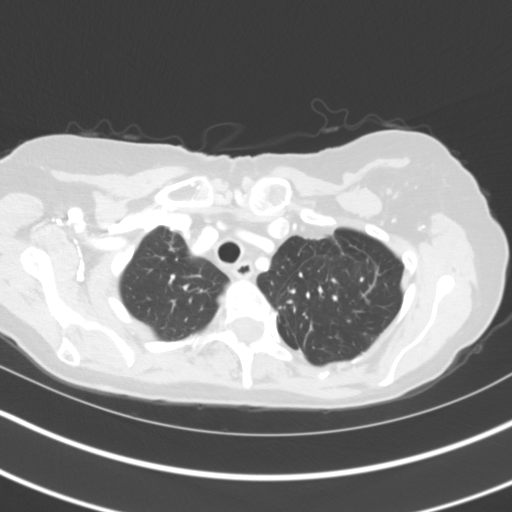
[im 94/106  lung]
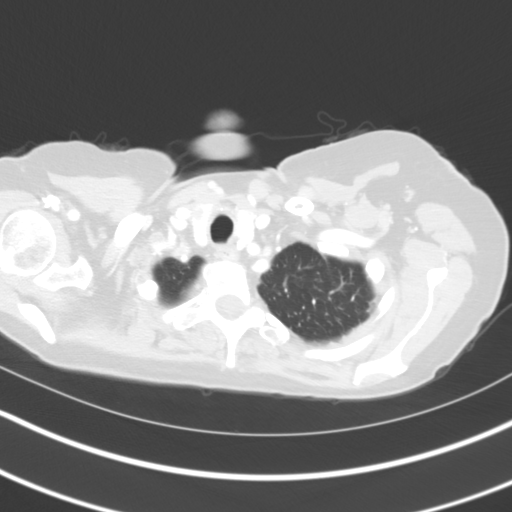
[im 102/106  lung]
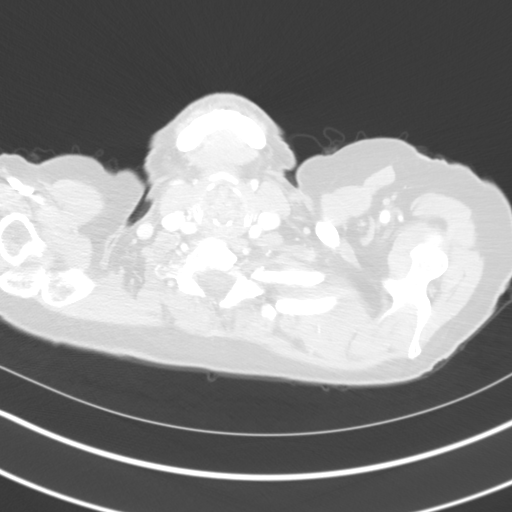

[15 of 32 positions shown; findings below may reference images not displayed]

FINDINGS: Small low-attenuation focus in the right thyroid is similar to prior, and
nonspecific. Small mass in the anterior mediastinum is similar to prior
studies. It measures 1.3 x 1.1 cm. There are prosthetic breast implants. The
main pulmonary artery is enlarged, as can be seen with pulmonary arterial
hypertension. It measures 3.2 cm in diameter. Calcifications are seen in the
coronary arteries. There is a small hiatal hernia. Small low-attenuation
lesion in the left hepatic lobe is similar to prior studies. Small
attenuation focus in the posterior right hepatic lobe is similar to prior.

4 mm nodule in the right middle lobe is similar to prior. 2 mm nodule in the
right middle lobe is similar to prior. There is some architectural
distortion in the left upper lobe which is similar to prior. This is likely
related to the reported history of prior surgery. There is a 6 mm nodule in
the left lower lobe which appears new from prior. This is seen on image 48.
4 mm nodule in the posterior left lower lobe is likely similar to prior
studies given differences in slice selection. Minimal opacity inferior to
the left hilum is similar to prior and likely secondary to scarring.

No aggressive lytic or sclerotic osseous lesions are identified. There is
some irregularity of the posterior left rib which is similar to prior and
may be congenital.
IMPRESSION: 1. New indeterminate 6 mm nodule in the left lower lobe. Followup
noncontrast chest CT is recommended in 6 months.
2. Other findings as above.

## 2015-01-31 NOTE — Discharge Summary (Signed)
PATIENT NAME:  Carla Sanchez, Carla Sanchez MR#:  [id number] DATE OF BIRTH:  July 07, 1936  DATE OF ADMISSION:  03/25/2012 DATE OF DISCHARGE:  03/27/2012  ADMITTING DIAGNOSIS: Degenerative arthrosis of the left hip.   DISCHARGE DIAGNOSIS: Degenerative arthrosis of the left hip.  HISTORY: The patient is a pleasant 79 year old who has been followed at [facility] for progression of left hip pain. The patient had reported a several month history of progressive left groin pain. She reported a "catching" or "grabbing" sensation with certain movements of the left hip. On occasion she was using a cane for ambulation due to the discomfort. The patient reported a burning type sensation along the left buttocks region that radiated towards the left groin and on occasion down the lateral aspect of the left leg to the level of the knee. The causalgia was present when she was sitting as well. The patient had denied any gross numbness or weakness to the left lower extremity. Prolonged ambulation tended to aggravate her hip and groin pain. The patient has only reported modest improvement in her pain following the intraarticular cortisone injections. X-rays taken in the orthopedic department of [facility] showed significant decrease in the joint space with essentially bone-on-bone articulation being noted. She was noted to have subchondral sclerosis as well as subchondral cyst formation noted. After discussion of the risks and benefits of surgical intervention, the patient expressed her understanding of the risks and benefits and agreed with plans for surgical intervention.   PROCEDURE: Left total hip arthroplasty.   ANESTHESIA: Spinal.   IMPLANTS UTILIZED: DePuy 13.5 mm small stature Prodigy femoral component, 52 mm outer diameter Pinnacle 100 acetabular component, a +4 mm neutral Pinnacle Marathon polyethylene liner, and a 36 mm M-Spec femoral head with a +1.5 mm neck length.   HOSPITAL COURSE: The patient tolerated  the procedure very well. She had no complications. She was then taken to the PAC-U where she was stabilized and then transferred to the orthopedic floor. The patient began receiving anticoagulation therapy of Lovenox 30 mg subcutaneous every 12 hours per anesthesia and pharmacy protocol. She was fitted with TED stockings bilaterally. These were allowed to be removed one hour per eight hour shift. The patient was also fitted with the AV-I compression foot pumps bilaterally set at 80 mmHg. The patient's calves have been nontender. There has been no evidence of any deep venous thromboses of the lower extremities. Heels were elevated off the bed using rolled towels. She has had no complaints.   The patient has denied any chest pain or shortness of breath. Vital signs have been stable. She has been afebrile. Hemodynamically she was stable and no transfusions were given.   Physical therapy was initiated on day one for gait training and transfers. Upon being discharged, she was ambulating greater than 200 feet. She was able go up and down four sets of steps. She was independent with bed to chair transfers. Occupational therapy was also initiated on day one for activities of daily living and assistive devices.   The patient's IV, Foley and Hemovac were discontinued on day two along with the dressing change. The wound was free of any drainage or signs of infection. She was noted to have a little bit of bruising.   DISPOSITION: The patient is being discharged to home in improved stable condition.   DISCHARGE INSTRUCTIONS: 1. She was gone over the hip precautions.  2. She may weight bear as tolerated. Continue using a walker until cleared by physical therapy to go  to a quad cane. She will receive home health physical therapy.  3. She is to continue wearing the TED stockings during the day but may remove these at night.  4. Staples will be removed in two weeks by physical therapy. They are to call for any  complications. She is not to take a shower until the staples are removed.  5. She is placed on an ADA diet.  6. She has a follow-up appointment with Dr. Marry Guan in six weeks.  7. She is to call the clinic sooner if any temperatures of 101.5 or greater or excessive bleeding.  8. The patient is to resume her regular medication that she was on prior to admission. The patient was given a prescription for Lovenox 40 mg for which she is to inject one daily for 14 days and then discontinue and begin taking one 81 mg enteric-coated aspirin per day. She was also given a prescription for Roxicodone 5 to 10 mg every 4 to 6 hours p.r.n. for pain dispensing 80 with Ultram 50 mg 1 to 2 tablets every 4 to 6 hours p.r.n. for pain dispensing 60.   PAST MEDICAL HISTORY:  1. Arthritis.  2. Chronic bronchitis. 3. Hemorrhoids.  4. Migraine headaches.  5. Shingles.  6. Fibrocystic breast disease.  7. Melanoma.  ____________________________ Vance Peper, PA jrw:slb D: 03/27/2012 13:48:14 ET T: 03/27/2012 14:24:02 ET JOB#: [id number]  cc: Vance Peper, PA, <Dictator> Damascus Feldpausch PA ELECTRONICALLY SIGNED 03/28/2012 21:44

## 2015-01-31 NOTE — Op Note (Signed)
PATIENT NAME:  Carla Sanchez, BUCHER MR#:  [id number] DATE OF BIRTH:  [birth year]  DATE OF PROCEDURE:  03/25/2012  PREOPERATIVE DIAGNOSIS: Degenerative arthrosis of the left hip.   POSTOPERATIVE DIAGNOSIS: Degenerative arthrosis of the left hip.   PROCEDURE PERFORMED: Left total hip arthroplasty.   SURGEON: Laurice Record. Holley Bouche., MD  ASSISTANT: Vance Peper, PA-C (required to maintain retraction throughout the procedure)   ANESTHESIA: Spinal.   ESTIMATED BLOOD LOSS: 700 mL.   FLUIDS REPLACED: 1500 mL of crystalloid and 500 mL of Hespan.   DRAINS: Two medium drains to Hemovac reservoir.   IMPLANTS UTILIZED: DePuy 13.5 mm small stature Prodigy femoral component, 52 mm outer diameter Pinnacle 100 acetabular component, a +4 mm neutral Pinnacle Marathon polyethylene liner, and a 36 mm M-Spec femoral head with a +1.5 mm neck length.   INDICATIONS FOR SURGERY: The patient is a 79 year old female who has been seen for complaints of progressive left hip and groin pain. X-rays demonstrated severe degenerative changes. After discussion of the risks and benefits of surgical intervention, the patient expressed her understanding of the risks and benefits and agreed with plans for left total hip arthroplasty.   PROCEDURE IN DETAIL: Patient was brought into the Operating Room and, after adequate spinal anesthesia was achieved, the patient was placed in the right lateral decubitus position. Axillary roll was placed and all bony prominences were well padded. The patient's left hip and leg were cleaned and prepped with alcohol and DuraPrep, draped in the usual sterile fashion. A "timeout" was performed as per usual protocol. A lateral curvilinear incision was made gently curving towards the posterior superior iliac spine. IT band was incised in line with the skin incision and the fibers of the gluteus maximus were split in line. Piriformis tendon was identified, skeletonized, and incised at its insertion at the  proximal femur and reflected posteriorly. In a similar fashion, the short external rotators were incised and reflected posteriorly. A T-type posterior capsulotomy was performed. Prior to dislocation of the femoral head, a threaded Steinmann pin was inserted through a separate stab incision into the pelvis superior to the acetabulum and then bent in the form of a stylus so as to assess limb length and hip offset throughout the procedure. Femoral head was then dislocated posteriorly. Severe degenerative changes were noted. Full thickness loss of articular cartilage was noted superiorly. Femoral neck cut was performed using an oscillating saw. The anterior capsule was elevated off of the femoral neck. Inspection of the acetabulum also demonstrated significant degenerative changes. The remnant of the labrum was excised. The acetabulum was reamed in a sequential fashion up to a 51 mm diameter. Good punctate bleeding bone was noted. A 52 mm outer diameter Pinnacle 100 acetabular component was positioned and impacted into place. Excellent scratch fit was appreciated. A +4 neutral polyethylene trial was inserted and attention was directed to the proximal femur. Pilot hole for reaming of the proximal femoral canal was created using high-speed bur. Proximal femoral canal was reamed in a sequential fashion up to a 13 mm diameter. This allowed for approximately 6 cm of scratch fit. Proximal femur was then prepared using a 13.5 mm aggressive side-biting reamer. Serial broaches were inserted up to a 13.5 mm small stature broach. Calcar region was planed and trial reduction was attempted with an AML head and neck segment with +1.5 mm neck length. Reasonably good stability was appreciated. The AML neck segment was then replaced with a Prodigy neck segment with a 36 mm trial  ball  with a +1.5 mm neck length. This allowed for improved posterior stability. Good equalization of limb lengths and restoration of hip offset was  appreciated. Trial components were removed. Acetabular shell was irrigated with copious amounts of normal saline with antibiotic solution and suctioned dry. A +4 mm neutral Pinnacle Marathon polyethylene liner was positioned and impacted into place. Next, a 13.5 mm small stature Prodigy femoral component was positioned and impacted in place. Excellent scratch fit was appreciated. Trial reduction was again performed with a 36 mm ball with a +1.5 mm neck segment. Again, excellent anterior and posterior stability was appreciated with equalization of limb lengths and restoration of hip offset. Trial hip ball was removed. The Morse taper was cleaned and dried. A 36 mm M-Spec femoral head was placed on the trunnion and impacted into place. Hip was reduced and placed through a range of motion. Excellent stability and range of motion was appreciated.   The wound was irrigated with copious amounts of normal saline with antibiotic solution using pulsatile lavage and then suctioned dry. Good hemostasis was appreciated. The posterior capsulotomy was repaired using #5 Ethibond. Piriformis tendon was reapproximated on the undersurface of the gluteus medius tendon using #5 Ethibond. Two medium drains were placed in the wound bed and brought out through a separate stab incision to be attached to a Hemovac reservoir. The IT band was repaired using interrupted sutures of #1 Vicryl. Subcutaneous tissue was approximated in layers using first #0 Vicryl followed by 2-0 Vicryl. Skin was closed with skin staples. A sterile dressing was applied.   Patient tolerated procedure well. She was transported to the recovery room in stable condition.    ____________________________ Laurice Record. Holley Bouche., MD jph:cms D: 03/25/2012 18:34:50 ET T: 03/26/2012 10:20:01 ET JOB#: [id number]  cc: Jeneen Rinks P. Holley Bouche., MD, <Dictator>  Laurice Record Holley Bouche MD ELECTRONICALLY SIGNED 03/29/2012 12:42

## 2015-02-23 DIAGNOSIS — E038 Other specified hypothyroidism: Secondary | ICD-10-CM | POA: Insufficient documentation

## 2015-02-23 DIAGNOSIS — Z86010 Personal history of colonic polyps: Secondary | ICD-10-CM | POA: Diagnosis not present

## 2015-02-23 DIAGNOSIS — K5909 Other constipation: Secondary | ICD-10-CM | POA: Diagnosis not present

## 2015-02-23 DIAGNOSIS — R931 Abnormal findings on diagnostic imaging of heart and coronary circulation: Secondary | ICD-10-CM | POA: Insufficient documentation

## 2015-03-26 ENCOUNTER — Encounter: Payer: Self-pay | Admitting: *Deleted

## 2015-03-29 ENCOUNTER — Ambulatory Visit
Admission: RE | Admit: 2015-03-29 | Discharge: 2015-03-29 | Disposition: A | Discharge status: Home Health | Payer: Medicare Other | Source: Ambulatory Visit | Attending: Gastroenterology | Admitting: Gastroenterology

## 2015-03-29 ENCOUNTER — Ambulatory Visit: Payer: Medicare Other | Admitting: Anesthesiology

## 2015-03-29 ENCOUNTER — Encounter
Admission: RE | Disposition: A | Discharge status: Home Health | Payer: Self-pay | Source: Ambulatory Visit | Attending: Gastroenterology

## 2015-03-29 DIAGNOSIS — Z888 Allergy status to other drugs, medicaments and biological substances status: Secondary | ICD-10-CM | POA: Diagnosis not present

## 2015-03-29 DIAGNOSIS — Z79899 Other long term (current) drug therapy: Secondary | ICD-10-CM | POA: Diagnosis not present

## 2015-03-29 DIAGNOSIS — K5900 Constipation, unspecified: Secondary | ICD-10-CM | POA: Diagnosis not present

## 2015-03-29 DIAGNOSIS — M1990 Unspecified osteoarthritis, unspecified site: Secondary | ICD-10-CM | POA: Insufficient documentation

## 2015-03-29 DIAGNOSIS — Z7982 Long term (current) use of aspirin: Secondary | ICD-10-CM | POA: Diagnosis not present

## 2015-03-29 DIAGNOSIS — K5730 Diverticulosis of large intestine without perforation or abscess without bleeding: Secondary | ICD-10-CM | POA: Insufficient documentation

## 2015-03-29 DIAGNOSIS — K6389 Other specified diseases of intestine: Secondary | ICD-10-CM | POA: Diagnosis not present

## 2015-03-29 DIAGNOSIS — G43909 Migraine, unspecified, not intractable, without status migrainosus: Secondary | ICD-10-CM | POA: Insufficient documentation

## 2015-03-29 DIAGNOSIS — Z85820 Personal history of malignant melanoma of skin: Secondary | ICD-10-CM | POA: Insufficient documentation

## 2015-03-29 DIAGNOSIS — Z86010 Personal history of colonic polyps: Secondary | ICD-10-CM | POA: Diagnosis not present

## 2015-03-29 HISTORY — DX: Migraine, unspecified, not intractable, without status migrainosus: G43.909

## 2015-03-29 HISTORY — DX: Unspecified osteoarthritis, unspecified site: M19.90

## 2015-03-29 HISTORY — DX: Malignant (primary) neoplasm, unspecified: C80.1

## 2015-03-29 HISTORY — DX: Unspecified hemorrhoids: K64.9

## 2015-03-29 HISTORY — PX: COLONOSCOPY WITH PROPOFOL: SHX5780

## 2015-03-29 SURGERY — COLONOSCOPY WITH PROPOFOL
Anesthesia: General

## 2015-03-29 MED ORDER — LIDOCAINE HCL (PF) 1 % IJ SOLN
INTRAMUSCULAR | Status: AC
  Administered 2015-03-29: 0.3 mL via INTRADERMAL
  Filled 2015-03-29: qty 2

## 2015-03-29 MED ORDER — LIDOCAINE HCL (PF) 1 % IJ SOLN
2.0000 mL | Freq: Once | INTRAMUSCULAR | Status: AC
  Administered 2015-03-29: 0.3 mL via INTRADERMAL

## 2015-03-29 MED ORDER — PROPOFOL INFUSION 10 MG/ML OPTIME
INTRAVENOUS | Status: DC | PRN
  Administered 2015-03-29: 75 ug/kg/min via INTRAVENOUS

## 2015-03-29 MED ORDER — SODIUM CHLORIDE 0.9 % IV SOLN: INTRAVENOUS | Status: DC

## 2015-03-29 MED ORDER — PROPOFOL 10 MG/ML IV BOLUS
INTRAVENOUS | Status: DC | PRN
  Administered 2015-03-29: 50 mg via INTRAVENOUS

## 2015-03-29 MED ORDER — LACTATED RINGERS IV SOLN
INTRAVENOUS | Status: DC | PRN
Start: 2015-03-29 — End: 2015-03-29
  Administered 2015-03-29: 13:00:00 via INTRAVENOUS

## 2015-03-29 MED ORDER — SODIUM CHLORIDE 0.9 % IV SOLN
1.0000 g | Freq: Once | INTRAVENOUS | Status: AC
  Administered 2015-03-29: 1 g via INTRAVENOUS
  Filled 2015-03-29 (×2): qty 1000

## 2015-03-29 MED ORDER — SODIUM CHLORIDE 0.9 % IV SOLN
INTRAVENOUS | Status: DC
  Administered 2015-03-29: 1000 mL via INTRAVENOUS

## 2015-03-29 NOTE — Op Note (Signed)
[facility] Patient Name: Carla Sanchez Procedure Date: 03/29/2015 12:29 PM MRN: [id number] Account #: [id number] Date of Birth: 1935/10/14 Admit Type: Outpatient Age: 79 Room: [facility] ENDO ROOM 2 Gender: Female Note Status: Finalized Procedure:         Colonoscopy Indications:       Personal history of colonic polyps, Constipation Providers:         Lollie Sails, MD Referring MD:      Jerrell Belfast, MD (Referring MD) Medicines:         Monitored Anesthesia Care Complications:     No immediate complications. Procedure:         Pre-Anesthesia Assessment:                    - ASA Grade Assessment: II - A patient with mild systemic                     disease.                    After obtaining informed consent, the colonoscope was                     passed under direct vision. Throughout the procedure, the                     patient's blood pressure, pulse, and oxygen saturations                     were monitored continuously. The Olympus PCF-160AL                     colonoscope (S#. [id number]) was introduced through the anus                     and advanced to the the cecum, identified by appendiceal                     orifice and ileocecal valve. The colonoscopy was performed                     without difficulty. The patient tolerated the procedure                     well. The quality of the bowel preparation was good. Findings:      Multiple small and large-mouthed diverticula were found in the sigmoid       colon, in the descending colon, at the splenic flexure and in the       transverse colon.      The colon (entire examined portion) was significantly tortuous.      The digital rectal exam was normal.      The retroflexed view of the distal rectum and anal verge was normal and       showed no anal or rectal abnormalities.      The digital rectal exam was normal.      on the inferior posterior of the right gluteus, there is a  pigmented       skin lesion about 1.5 cm in diameter. picture taken. Impression:        - Diverticulosis in the sigmoid colon, in the descending                     colon, at the splenic flexure and  in the transverse colon.                    - Tortuous colon.                    - The distal rectum and anal verge are normal on                     retroflexion view.                    - No specimens collected. Recommendation:    - Miralax 17 g (1 capful) in 8 oz water PO daily daily.                    - Telephone GI clinic if symptomatic in 3 weeks.                    - Refer to dermatologist at appointment to be scheduled. Procedure Code(s): --- Professional ---                    [phone], Colonoscopy, flexible; diagnostic, including                     collection of specimen(s) by brushing or washing, when                     performed (separate procedure) Diagnosis Code(s): --- Professional ---                    V12.72, Personal history of colonic polyps                    564.00, Constipation, unspecified                    562.10, Diverticulosis of colon (without mention of                     hemorrhage)                    751.5, Other anomalies of intestine CPT copyright 2014 [employer]. All rights reserved. The codes documented in this report are preliminary and upon coder review may  be revised to meet current compliance requirements. Lollie Sails, MD 03/29/2015 1:30:51 PM This report has been signed electronically. Number of Addenda: 0 Note Initiated On: 03/29/2015 12:29 PM Scope Withdrawal Time: 0 hours 6 minutes 10 seconds  Total Procedure Duration: 0 hours 38 minutes 17 seconds       [facility]

## 2015-03-29 NOTE — H&P (Signed)
Outpatient short stay form Pre-procedure 03/29/2015 12:32 PM Lollie Sails MD  Primary Physician: Dr. Margarita Rana  Reason for visit:  History of colon polyps and also problems with constipation  History of present illness:  79 year old female history of colon polyps and a colonoscopy in 2007. She did not come in in 2012 as recommended. He suddenly with constipation. She's been taking up to 12 prunes a day also has started on some Metamucil that seems to be helped some but does not resolve the issue. She occasionally will see some bright red bleeding with the stool. Her stools at times are quite firm difficult to eliminate.    Current facility-administered medications:  .  0.9 %  sodium chloride infusion, , Intravenous, Continuous, Lollie Sails, MD, Last Rate: 50 mL/hr at 03/29/15 1207, 1,000 mL at 03/29/15 1207 .  0.9 %  sodium chloride infusion, , Intravenous, Continuous, Lollie Sails, MD .  ampicillin (OMNIPEN) 1 g in sodium chloride 0.9 % 50 mL IVPB, 1 g, Intravenous, Once, Lollie Sails, MD, 1 g at 03/29/15 1221  Prescriptions prior to admission  Medication Sig Dispense Refill Last Dose  . ascorbic acid (VITAMIN C) 500 MG tablet Take 500 mg by mouth daily.     Marland Kitchen aspirin 81 MG tablet Take 81 mg by mouth daily.   03/22/2015  . Calcium Carb-Cholecalciferol (CALCIUM CARBONATE-VITAMIN D3) 600-400 MG-UNIT TABS Take 1 tablet by mouth daily.     . Multiple Vitamin (MULTIVITAMIN) tablet Take 1 tablet by mouth daily.     . naproxen sodium (ANAPROX) 220 MG tablet Take 220 mg by mouth 2 (two) times daily with a meal.     . Omega 3 1000 MG CAPS Take 2 capsules by mouth daily.     Marland Kitchen pyridoxine (B-6) 100 MG tablet Take 100 mg by mouth daily.     . vitamin B-12 (CYANOCOBALAMIN) 1000 MCG tablet Take 1,000 mcg by mouth daily.        Allergies  Allergen Reactions  . Celebrex [Celecoxib]      Past Medical History  Diagnosis Date  . Arthritis   . Hemorrhoids   . Migraines    . Cancer     Malignant Melanoma    Review of systems:      Physical Exam    Heart and lungs: Regular rate and rhythm without rub or gallop lungs are bilaterally clear    HEENT: Normocephalic atraumatic eyes are anicteric    Other:     Pertinant exam for procedure: Soft nontender nondistended bowel sounds positive normoactive    Planned proceedures: Colonoscopy with indicated procedures. She tolerated her prep well. The last time she took any aspirin was a week ago. I have discussed the risks benefits and complications of procedures to include not limited to bleeding, infection, perforation and the risk of sedation and the patient wishes to proceed.    Lollie Sails, MD Gastroenterology 03/29/2015  12:32 PM

## 2015-03-29 NOTE — Anesthesia Postprocedure Evaluation (Signed)
  Anesthesia Post-op Note  Patient: Carla Sanchez  Procedure(s) Performed: Procedure(s): COLONOSCOPY WITH PROPOFOL (N/A)  Anesthesia type:General  Patient location: PACU  Post pain: Pain level controlled  Post assessment: Post-op Vital signs reviewed, Patient's Cardiovascular Status Stable, Respiratory Function Stable, Patent Airway and No signs of Nausea or vomiting  Post vital signs: Reviewed and stable  Last Vitals:  Filed Vitals:   03/29/15 1146  BP: 130/90  Pulse: 75  Temp: 36.2 C  Resp: 17    Level of consciousness: awake, alert  and patient cooperative  Complications: No apparent anesthesia complications

## 2015-03-29 NOTE — Anesthesia Preprocedure Evaluation (Signed)
Anesthesia Evaluation  Patient identified by MRN, date of birth, ID band Patient awake    Reviewed: Allergy & Precautions, NPO status , Patient's Chart, lab work & pertinent test results  History of Anesthesia Complications Negative for: history of anesthetic complications  Airway Mallampati: II       Dental  (+) Teeth Intact   Pulmonary neg pulmonary ROS,    Pulmonary exam normal       Cardiovascular negative cardio ROS Normal cardiovascular exam    Neuro/Psych  Headaches, negative psych ROS   GI/Hepatic negative GI ROS,   Endo/Other  negative endocrine ROS  Renal/GU negative Renal ROS  negative genitourinary   Musculoskeletal  (+) Arthritis -, Osteoarthritis,    Abdominal Normal abdominal exam  (+)   Peds negative pediatric ROS (+)  Hematology negative hematology ROS (+)   Anesthesia Other Findings   Reproductive/Obstetrics negative OB ROS                             Anesthesia Physical Anesthesia Plan  ASA: II  Anesthesia Plan: General   Post-op Pain Management:    Induction: Intravenous  Airway Management Planned: Nasal Cannula  Additional Equipment:   Intra-op Plan:   Post-operative Plan:   Informed Consent: I have reviewed the patients History and Physical, chart, labs and discussed the procedure including the risks, benefits and alternatives for the proposed anesthesia with the patient or authorized representative who has indicated his/her understanding and acceptance.     Plan Discussed with: CRNA  Anesthesia Plan Comments:         Anesthesia Quick Evaluation

## 2015-03-29 NOTE — Anesthesia Postprocedure Evaluation (Signed)
  Anesthesia Post-op Note  Patient: Carla Sanchez  Procedure(s) Performed: Procedure(s): COLONOSCOPY WITH PROPOFOL (N/A)  Anesthesia type:General  Patient location: PACU  Post pain: Pain level controlled  Post assessment: Post-op Vital signs reviewed, Patient's Cardiovascular Status Stable, Respiratory Function Stable, Patent Airway and No signs of Nausea or vomiting  Post vital signs: Reviewed and stable  Last Vitals:  Filed Vitals:   03/29/15 1340  BP:   Pulse: 80  Temp:   Resp: 18    Level of consciousness: awake, alert  and patient cooperative  Complications: No apparent anesthesia complications

## 2015-03-29 NOTE — Transfer of Care (Signed)
Immediate Anesthesia Transfer of Care Note  Patient: Carla Sanchez  Procedure(s) Performed: Procedure(s): COLONOSCOPY WITH PROPOFOL (N/A)  Patient Location: PACU and Endoscopy Unit  Anesthesia Type:General  Level of Consciousness: awake, alert  and oriented  Airway & Oxygen Therapy: Patient Spontanous Breathing and Patient connected to nasal cannula oxygen  Post-op Assessment: Report given to RN and Post -op Vital signs reviewed and stable  Post vital signs: Reviewed and stable  Last Vitals:  Filed Vitals:   03/29/15 1146  BP: 130/90  Pulse: 75  Temp: 36.2 C  Resp: 17    Complications: No apparent anesthesia complications

## 2015-04-02 DIAGNOSIS — D485 Neoplasm of uncertain behavior of skin: Secondary | ICD-10-CM | POA: Diagnosis not present

## 2015-04-02 DIAGNOSIS — Z85820 Personal history of malignant melanoma of skin: Secondary | ICD-10-CM | POA: Diagnosis not present

## 2015-04-02 DIAGNOSIS — C44519 Basal cell carcinoma of skin of other part of trunk: Secondary | ICD-10-CM | POA: Diagnosis not present

## 2015-04-02 DIAGNOSIS — D225 Melanocytic nevi of trunk: Secondary | ICD-10-CM | POA: Diagnosis not present

## 2015-04-02 DIAGNOSIS — D0439 Carcinoma in situ of skin of other parts of face: Secondary | ICD-10-CM | POA: Diagnosis not present

## 2015-04-02 DIAGNOSIS — D2261 Melanocytic nevi of right upper limb, including shoulder: Secondary | ICD-10-CM | POA: Diagnosis not present

## 2015-04-02 DIAGNOSIS — Z85828 Personal history of other malignant neoplasm of skin: Secondary | ICD-10-CM | POA: Diagnosis not present

## 2015-04-05 ENCOUNTER — Encounter: Payer: Self-pay | Admitting: Gastroenterology

## 2015-04-09 DIAGNOSIS — C44519 Basal cell carcinoma of skin of other part of trunk: Secondary | ICD-10-CM | POA: Diagnosis not present

## 2015-04-09 DIAGNOSIS — L905 Scar conditions and fibrosis of skin: Secondary | ICD-10-CM | POA: Diagnosis not present

## 2015-04-09 HISTORY — PX: HAND SURGERY: SHX662

## 2015-05-25 DIAGNOSIS — M25231 Flail joint, right wrist: Secondary | ICD-10-CM | POA: Diagnosis not present

## 2015-05-25 DIAGNOSIS — G5601 Carpal tunnel syndrome, right upper limb: Secondary | ICD-10-CM | POA: Diagnosis not present

## 2015-05-25 DIAGNOSIS — M1811 Unilateral primary osteoarthritis of first carpometacarpal joint, right hand: Secondary | ICD-10-CM | POA: Diagnosis not present

## 2015-05-25 DIAGNOSIS — D481 Neoplasm of uncertain behavior of connective and other soft tissue: Secondary | ICD-10-CM | POA: Diagnosis not present

## 2015-05-25 DIAGNOSIS — G8918 Other acute postprocedural pain: Secondary | ICD-10-CM | POA: Diagnosis not present

## 2015-06-10 DIAGNOSIS — Z4789 Encounter for other orthopedic aftercare: Secondary | ICD-10-CM | POA: Diagnosis not present

## 2015-06-23 DIAGNOSIS — Z4789 Encounter for other orthopedic aftercare: Secondary | ICD-10-CM | POA: Diagnosis not present

## 2015-07-01 DIAGNOSIS — D2239 Melanocytic nevi of other parts of face: Secondary | ICD-10-CM | POA: Diagnosis not present

## 2015-07-01 DIAGNOSIS — L820 Inflamed seborrheic keratosis: Secondary | ICD-10-CM | POA: Diagnosis not present

## 2015-07-07 DIAGNOSIS — Z4789 Encounter for other orthopedic aftercare: Secondary | ICD-10-CM | POA: Diagnosis not present

## 2015-07-20 DIAGNOSIS — G5601 Carpal tunnel syndrome, right upper limb: Secondary | ICD-10-CM | POA: Diagnosis not present

## 2015-07-20 DIAGNOSIS — M1811 Unilateral primary osteoarthritis of first carpometacarpal joint, right hand: Secondary | ICD-10-CM | POA: Diagnosis not present

## 2015-07-21 ENCOUNTER — Ambulatory Visit (INDEPENDENT_AMBULATORY_CARE_PROVIDER_SITE_OTHER): Payer: Medicare Other

## 2015-07-21 DIAGNOSIS — Z23 Encounter for immunization: Secondary | ICD-10-CM | POA: Diagnosis not present

## 2015-07-29 DIAGNOSIS — Z4789 Encounter for other orthopedic aftercare: Secondary | ICD-10-CM | POA: Diagnosis not present

## 2015-07-29 DIAGNOSIS — M1811 Unilateral primary osteoarthritis of first carpometacarpal joint, right hand: Secondary | ICD-10-CM | POA: Diagnosis not present

## 2015-08-16 DIAGNOSIS — M1811 Unilateral primary osteoarthritis of first carpometacarpal joint, right hand: Secondary | ICD-10-CM | POA: Diagnosis not present

## 2015-09-09 DIAGNOSIS — M1811 Unilateral primary osteoarthritis of first carpometacarpal joint, right hand: Secondary | ICD-10-CM | POA: Diagnosis not present

## 2015-09-09 DIAGNOSIS — Z4789 Encounter for other orthopedic aftercare: Secondary | ICD-10-CM | POA: Diagnosis not present

## 2015-10-07 DIAGNOSIS — H52223 Regular astigmatism, bilateral: Secondary | ICD-10-CM | POA: Diagnosis not present

## 2015-10-07 DIAGNOSIS — H00029 Hordeolum internum unspecified eye, unspecified eyelid: Secondary | ICD-10-CM | POA: Diagnosis not present

## 2015-10-07 DIAGNOSIS — H26493 Other secondary cataract, bilateral: Secondary | ICD-10-CM | POA: Diagnosis not present

## 2015-10-07 DIAGNOSIS — Z9849 Cataract extraction status, unspecified eye: Secondary | ICD-10-CM | POA: Diagnosis not present

## 2015-10-07 DIAGNOSIS — H04123 Dry eye syndrome of bilateral lacrimal glands: Secondary | ICD-10-CM | POA: Diagnosis not present

## 2015-10-08 DIAGNOSIS — H52223 Regular astigmatism, bilateral: Secondary | ICD-10-CM | POA: Diagnosis not present

## 2015-10-08 DIAGNOSIS — H00029 Hordeolum internum unspecified eye, unspecified eyelid: Secondary | ICD-10-CM | POA: Diagnosis not present

## 2015-10-08 DIAGNOSIS — Z9849 Cataract extraction status, unspecified eye: Secondary | ICD-10-CM | POA: Diagnosis not present

## 2015-10-08 DIAGNOSIS — H26493 Other secondary cataract, bilateral: Secondary | ICD-10-CM | POA: Diagnosis not present

## 2015-10-08 DIAGNOSIS — H04123 Dry eye syndrome of bilateral lacrimal glands: Secondary | ICD-10-CM | POA: Diagnosis not present

## 2015-10-13 ENCOUNTER — Ambulatory Visit
Admission: RE | Admit: 2015-10-13 | Discharge: 2015-10-13 | Disposition: A | Discharge status: Home / Self Care | Payer: Medicare Other | Source: Ambulatory Visit | Attending: Family Medicine | Admitting: Family Medicine

## 2015-10-13 ENCOUNTER — Other Ambulatory Visit
Admission: RE | Admit: 2015-10-13 | Discharge: 2015-10-13 | Disposition: A | Discharge status: Home / Self Care | Payer: Medicare Other | Source: Ambulatory Visit | Attending: Family Medicine | Admitting: Family Medicine

## 2015-10-13 ENCOUNTER — Ambulatory Visit (INDEPENDENT_AMBULATORY_CARE_PROVIDER_SITE_OTHER): Payer: Medicare Other | Admitting: Family Medicine

## 2015-10-13 ENCOUNTER — Encounter: Payer: Self-pay | Admitting: Family Medicine

## 2015-10-13 ENCOUNTER — Telehealth: Payer: Self-pay

## 2015-10-13 VITALS — BP 100/62 | HR 80 | Temp 97.9°F | Resp 20 | Ht 63.0 in | Wt 149.0 lb

## 2015-10-13 DIAGNOSIS — Z9882 Breast implant status: Secondary | ICD-10-CM | POA: Insufficient documentation

## 2015-10-13 DIAGNOSIS — J449 Chronic obstructive pulmonary disease, unspecified: Secondary | ICD-10-CM | POA: Insufficient documentation

## 2015-10-13 DIAGNOSIS — R918 Other nonspecific abnormal finding of lung field: Secondary | ICD-10-CM | POA: Insufficient documentation

## 2015-10-13 DIAGNOSIS — R079 Chest pain, unspecified: Secondary | ICD-10-CM

## 2015-10-13 DIAGNOSIS — E7800 Pure hypercholesterolemia, unspecified: Secondary | ICD-10-CM | POA: Insufficient documentation

## 2015-10-13 DIAGNOSIS — R0602 Shortness of breath: Secondary | ICD-10-CM | POA: Insufficient documentation

## 2015-10-13 DIAGNOSIS — J309 Allergic rhinitis, unspecified: Secondary | ICD-10-CM | POA: Insufficient documentation

## 2015-10-13 DIAGNOSIS — M5080 Other cervical disc disorders, unspecified cervical region: Secondary | ICD-10-CM | POA: Insufficient documentation

## 2015-10-13 DIAGNOSIS — R9389 Abnormal findings on diagnostic imaging of other specified body structures: Secondary | ICD-10-CM | POA: Insufficient documentation

## 2015-10-13 DIAGNOSIS — D150 Benign neoplasm of thymus: Secondary | ICD-10-CM

## 2015-10-13 DIAGNOSIS — K5909 Other constipation: Secondary | ICD-10-CM | POA: Insufficient documentation

## 2015-10-13 DIAGNOSIS — D4989 Neoplasm of unspecified behavior of other specified sites: Secondary | ICD-10-CM | POA: Insufficient documentation

## 2015-10-13 LAB — CBC
HEMATOCRIT: 40.1 % (ref 35.0–47.0)
Hemoglobin: 13.2 g/dL (ref 12.0–16.0)
MCH: 27.7 pg (ref 26.0–34.0)
MCHC: 33 g/dL (ref 32.0–36.0)
MCV: 83.8 fL (ref 80.0–100.0)
Platelets: 243 10*3/uL (ref 150–440)
RBC: 4.78 MIL/uL (ref 3.80–5.20)
RDW: 14.5 % (ref 11.5–14.5)
WBC: 8.1 10*3/uL (ref 3.6–11.0)

## 2015-10-13 LAB — COMPREHENSIVE METABOLIC PANEL
ALBUMIN: 4.4 g/dL (ref 3.5–5.0)
ALT: 20 U/L (ref 14–54)
AST: 25 U/L (ref 15–41)
Alkaline Phosphatase: 66 U/L (ref 38–126)
Anion gap: 7 (ref 5–15)
BUN: 15 mg/dL (ref 6–20)
CHLORIDE: 105 mmol/L (ref 101–111)
CO2: 28 mmol/L (ref 22–32)
CREATININE: 0.8 mg/dL (ref 0.44–1.00)
Calcium: 10.1 mg/dL (ref 8.9–10.3)
GFR calc Af Amer: >60 mL/min (ref >60)
GFR calc non Af Amer: >60 mL/min (ref >60)
GLUCOSE: 99 mg/dL (ref 65–99)
Potassium: 4.6 mmol/L (ref 3.5–5.1)
Sodium: 140 mmol/L (ref 135–145)
Total Bilirubin: 0.7 mg/dL (ref 0.3–1.2)
Total Protein: 6.8 g/dL (ref 6.5–8.1)

## 2015-10-13 LAB — TROPONIN I

## 2015-10-13 LAB — CKMB (ARMC ONLY): CK, MB: 1.9 ng/mL (ref 0.5–5.0)

## 2015-10-13 NOTE — Telephone Encounter (Signed)
-----   Message from Margarita Rana, MD sent at 10/13/2015  4:31 PM EST ----- Labs reassuring.  CXR shows scar tissue.  Maybe cause of symptoms. Follow up with Dr. Lillie Fragmin  On Friday at 10:45.  Have patient call if any further symptoms develop, especially signs of infection and/or increased cough.Thanks.

## 2015-10-13 NOTE — Telephone Encounter (Signed)
Advised pt of lab results. Pt verbally acknowledges understanding. Pt will call office to schedule OV for possible COPD. Renaldo Fiddler, CMA

## 2015-10-13 NOTE — Progress Notes (Signed)
Subjective:    Patient ID: Carla Sanchez, female    DOB: Apr 30, 1936, 80 y.o.   MRN: [id number]  Chest Pain  This is a new problem. The current episode started in the past 7 days (Started Sunday am with pain in left shoulder blade.  Did ease off. ). The pain is present in the lateral region (left sided chest pain. Eased off and then came back last night, but  different. ). The pain is at a severity of 6/10. The pain is moderate. The quality of the pain is described as sharp. The pain radiates to the left shoulder and left arm (Also in left chest.  ). Associated symptoms include a cough (not much. Does not cause the pain. ), exertional chest pressure (not related to activity.  ), malaise/fatigue, numbness (tingling left fingers.   ), shortness of breath (all the time,  not new, but tighter.  Feels ace bandage around her chest.  ) and weakness. Pertinent negatives include no abdominal pain, back pain, claudication, diaphoresis, dizziness, fever, headaches, hemoptysis, irregular heartbeat, leg pain, lower extremity edema, nausea, near-syncope, orthopnea, palpitations, syncope or vomiting. Sputum production: yellowish, slight. The cough has no precipitants. She has tried NSAIDs for the symptoms. The treatment provided moderate relief. Risk factors include post-menopausal, being elderly and stress.      Review of Systems  Constitutional: Positive for malaise/fatigue. Negative for fever and diaphoresis.  Respiratory: Positive for cough (not much. Does not cause the pain. ) and shortness of breath (all the time,  not new, but tighter.  Feels ace bandage around her chest.  ). Negative for hemoptysis. Sputum production: yellowish, slight.   Cardiovascular: Positive for chest pain. Negative for palpitations, orthopnea, claudication, syncope and near-syncope.  Gastrointestinal: Negative for nausea, vomiting and abdominal pain.  Musculoskeletal: Negative for back pain.  Neurological: Positive for  weakness and numbness (tingling left fingers.   ). Negative for dizziness and headaches.   BP 100/62 mmHg  Pulse 80  Temp(Src) 97.9 F (36.6 C) (Oral)  Resp 20  Ht _0  (1.6 m)  Wt 149 lb (67.586 kg)  BMI 26.40 kg/m2  SpO2 97%   Patient Active Problem List   Diagnosis Date Noted  . Allergic rhinitis 10/13/2015  . Abnormal CAT scan 10/13/2015  . Calcification of intervertebral cartilage or disc of cervical region 10/13/2015  . Chronic constipation 10/13/2015  . H/O breast augmentation 10/13/2015  . Calcium blood increased 10/13/2015  . Hypercholesterolemia 10/13/2015  . Lung mass 10/13/2015  . 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 Abnormal echocardiogram 02/23/2015  . Hypothyroidism due to fibrous invasive thyroiditis 02/23/2015  . CMC arthritis, thumb, degenerative 10/13/2014  . OP (osteoporosis) 09/30/2009  . Benign neoplasm of colon 07/08/2003  . Benign neoplasm of large bowel 07/08/2003  . Colon, diverticulosis 07/08/2003  . Adaptive colitis 07/08/2003  . Malignant melanoma of skin of lower limb, including hip ([place]) 09/21/1994  . Disorder of mitral valve 08/02/1993   Past Medical History  Diagnosis Date  . Arthritis   . Hemorrhoids   . Migraines   . Cancer [facility])     Malignant Melanoma   Current Outpatient Prescriptions on File Prior to Visit  Medication Sig  . ascorbic acid (VITAMIN C) 500 MG tablet Take 500 mg by mouth daily.  Marland Kitchen aspirin 81 MG tablet Take 81 mg by mouth daily.  . Calcium Carb-Cholecalciferol (CALCIUM CARBONATE-VITAMIN D3) 600-400 MG-UNIT TABS Take 1 tablet by mouth daily.  . Multiple Vitamin (MULTIVITAMIN) tablet Take 1 tablet by  mouth daily.  . naproxen sodium (ANAPROX) 220 MG tablet Take 220 mg by mouth 2 (two) times daily with a meal.  . Omega 3 1000 MG CAPS Take 1 capsule by mouth daily.   Marland Kitchen pyridoxine (B-6) 100 MG tablet Take 100 mg by mouth daily.  . vitamin B-12 (CYANOCOBALAMIN) 1000 MCG tablet Take 1,000 mcg by mouth daily.   No current  facility-administered medications on file prior to visit.   Allergies  Allergen Reactions  . Celebrex [Celecoxib]    Past Surgical History  Procedure Laterality Date  . Abdominal hysterectomy    . Hemorrhoidectomy with hemorrhoid banding    . Joint replacement      RT TKR; LT TKR; RT Total Hip Arthroplasty; LT Hip Arthroplasty  . Bladder tack    . Breast surgery      Bilateral Mastectomy  . Colonoscopy with propofol N/A 03/29/2015    Procedure: COLONOSCOPY WITH PROPOFOL;  Surgeon: Lollie Sails, MD;  Location: [facility] ENDOSCOPY;  Service: Endoscopy;  Laterality: N/A;   Social History   Social History  . Marital Status: Married    Spouse Name: N/A  . Number of Children: N/A  . Years of Education: N/A   Occupational History  . Not on file.   Social History Main Topics  . Smoking status: Never Smoker   . Smokeless tobacco: Never Used  . Alcohol Use: No  . Drug Use: No  . Sexual Activity: Not on file   Other Topics Concern  . Not on file   Social History Narrative   Family History  Problem Relation Age of Onset  . Heart failure Mother   . Arthritis Mother   . Hypertension Father   . Heart attack Father   . Arthritis Other   . Heart attack Other   . Epilepsy Other   . Thyroid cancer Other   . Myasthenia gravis Other   . Prostate cancer Other       Objective:   Physical Exam  Constitutional: She is oriented to person, place, and time. She appears well-developed and well-nourished.  Neck: Normal range of motion. Neck supple.  Cardiovascular: Normal rate and regular rhythm.   Pulmonary/Chest: Effort normal and breath sounds normal.  Neurological: She is alert and oriented to person, place, and time.  Psychiatric: She has a normal mood and affect. Her behavior is normal. Judgment and thought content normal.   BP 100/62 mmHg  Pulse 80  Temp(Src) 97.9 F (36.6 C) (Oral)  Resp 20  Ht _0  (1.6 m)  Wt 149 lb (67.586 kg)  BMI 26.40 kg/m2  SpO2 97%        Assessment & Plan:  1. Chest pain, unspecified chest pain type Unclear etiology. Did talk with Dr. Saralyn Pilar who thought could be related to her valve.   Will see her Friday. Will check labs today to rule out acute ischemia. ER if worsens or does not improve. Did recommend ER for evaluation but patient refused. Will do STAT labs including Troponin,  CKMB, Met c and CBC.   - EKG 12-Lead - DG Chest 2 View; Future  2. Shortness of breath Further plan pending initial work up.   Patient was seen and examined by Jerrell Belfast, MD, and note scribed by Renaldo Fiddler, CMA. I have reviewed the document for accuracy and completeness and I agree with above. Jerrell Belfast, MD   Margarita Rana, MD

## 2015-10-15 DIAGNOSIS — I059 Rheumatic mitral valve disease, unspecified: Secondary | ICD-10-CM | POA: Diagnosis not present

## 2015-10-15 DIAGNOSIS — R079 Chest pain, unspecified: Secondary | ICD-10-CM | POA: Insufficient documentation

## 2015-10-15 DIAGNOSIS — R931 Abnormal findings on diagnostic imaging of heart and coronary circulation: Secondary | ICD-10-CM | POA: Diagnosis not present

## 2015-10-25 DIAGNOSIS — R079 Chest pain, unspecified: Secondary | ICD-10-CM | POA: Diagnosis not present

## 2015-10-25 DIAGNOSIS — I059 Rheumatic mitral valve disease, unspecified: Secondary | ICD-10-CM | POA: Diagnosis not present

## 2015-10-25 DIAGNOSIS — R931 Abnormal findings on diagnostic imaging of heart and coronary circulation: Secondary | ICD-10-CM | POA: Diagnosis not present

## 2015-11-09 DIAGNOSIS — H00022 Hordeolum internum right lower eyelid: Secondary | ICD-10-CM | POA: Diagnosis not present

## 2015-11-09 DIAGNOSIS — H26493 Other secondary cataract, bilateral: Secondary | ICD-10-CM | POA: Diagnosis not present

## 2015-11-09 DIAGNOSIS — Z9842 Cataract extraction status, left eye: Secondary | ICD-10-CM | POA: Diagnosis not present

## 2015-11-09 DIAGNOSIS — H04123 Dry eye syndrome of bilateral lacrimal glands: Secondary | ICD-10-CM | POA: Diagnosis not present

## 2015-11-09 DIAGNOSIS — Z9841 Cataract extraction status, right eye: Secondary | ICD-10-CM | POA: Diagnosis not present

## 2015-12-14 ENCOUNTER — Encounter: Payer: Self-pay | Admitting: *Deleted

## 2015-12-30 DIAGNOSIS — Z85820 Personal history of malignant melanoma of skin: Secondary | ICD-10-CM | POA: Diagnosis not present

## 2015-12-30 DIAGNOSIS — L821 Other seborrheic keratosis: Secondary | ICD-10-CM | POA: Diagnosis not present

## 2015-12-30 DIAGNOSIS — Z08 Encounter for follow-up examination after completed treatment for malignant neoplasm: Secondary | ICD-10-CM | POA: Diagnosis not present

## 2015-12-30 DIAGNOSIS — D1801 Hemangioma of skin and subcutaneous tissue: Secondary | ICD-10-CM | POA: Diagnosis not present

## 2016-03-20 ENCOUNTER — Encounter: Payer: Self-pay | Admitting: Family Medicine

## 2016-03-20 ENCOUNTER — Ambulatory Visit (INDEPENDENT_AMBULATORY_CARE_PROVIDER_SITE_OTHER): Payer: Medicare Other | Admitting: Family Medicine

## 2016-03-20 VITALS — BP 116/66 | HR 60 | Temp 98.6°F | Resp 16 | Ht 63.0 in | Wt 153.0 lb

## 2016-03-20 DIAGNOSIS — E7800 Pure hypercholesterolemia, unspecified: Secondary | ICD-10-CM | POA: Diagnosis not present

## 2016-03-20 DIAGNOSIS — Z0000 Encounter for general adult medical examination without abnormal findings: Secondary | ICD-10-CM

## 2016-03-20 NOTE — Progress Notes (Signed)
Patient ID: Carla Sanchez, female   DOB: 1936/08/27, 80 y.o.   MRN: [id number]       Patient: Carla Sanchez, Female    DOB: May 22, 1936, 80 y.o.   MRN: [id number] Visit Date: 03/20/2016  Today's Provider: Margarita Rana, MD   Chief Complaint  Patient presents with  . Medicare Wellness   Subjective:    Annual wellness visit [name] is a 80 y.o. female. She feels well. She reports exercising active with daily activites. She reports she is sleeping fairly well.  08/23/14 AWE 10/14/14 BMD-osteopenia, recheckin 2 yrs 03/29/15 Colonoscopy-diverticulosis, tortuous colon -----------------------------------------------------------   Review of Systems  Constitutional: Negative.   HENT: Negative.   Eyes: Negative.   Respiratory: Positive for shortness of breath.   Cardiovascular: Negative.   Gastrointestinal: Positive for constipation.  Endocrine: Negative.   Genitourinary: Negative.   Musculoskeletal: Positive for back pain.  Skin: Negative.   Allergic/Immunologic: Negative.   Neurological: Negative.   Hematological: Bruises/bleeds easily.  Psychiatric/Behavioral: Negative.     Social History   Social History  . Marital Status: Married    Spouse Name: N/A  . Number of Children: 4  . Years of Education: N/A   Occupational History  . Not on file.   Social History Main Topics  . Smoking status: Never Smoker   . Smokeless tobacco: Never Used  . Alcohol Use: No  . Drug Use: No  . Sexual Activity: Not on file   Other Topics Concern  . Not on file   Social History Narrative    Past Medical History  Diagnosis Date  . Arthritis   . Hemorrhoids   . Migraines   . Cancer [facility])     Malignant Melanoma     Patient Active Problem List   Diagnosis Date Noted  . Chest pain 10/15/2015  . Allergic rhinitis 10/13/2015  . Abnormal CAT scan 10/13/2015  . Calcification of intervertebral cartilage or disc of cervical region 10/13/2015  . Chronic  constipation 10/13/2015  . H/O breast augmentation 10/13/2015  . Calcium blood increased 10/13/2015  . Hypercholesterolemia 10/13/2015  . Lung mass 10/13/2015  . 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 Shortness of breath 10/13/2015  . Abnormal echocardiogram 02/23/2015  . Hypothyroidism due to fibrous invasive thyroiditis 02/23/2015  . CMC arthritis, thumb, degenerative 10/13/2014  . OP (osteoporosis) 09/30/2009  . Benign neoplasm of colon 07/08/2003  . Benign neoplasm of large bowel 07/08/2003  . Colon, diverticulosis 07/08/2003  . Adaptive colitis 07/08/2003  . Malignant melanoma of skin of lower limb, including hip ([place]) 09/21/1994  . Disorder of mitral valve 08/02/1993    Past Surgical History  Procedure Laterality Date  . Hemorrhoidectomy with hemorrhoid banding    . Joint replacement      RT TKR; LT TKR; RT Total Hip Arthroplasty; LT Hip Arthroplasty  . Bladder tack    . Breast surgery      Bilateral Mastectomy  . Colonoscopy with propofol N/A 03/29/2015    Procedure: COLONOSCOPY WITH PROPOFOL;  Surgeon: Lollie Sails, MD;  Location: [facility] ENDOSCOPY;  Service: Endoscopy;  Laterality: N/A;  . Lung surgery      at age 11, also removed a rib  . Hand surgery Right 04/2015  . Abdominal hysterectomy      in her 29's    Her family history includes Arthritis in her mother and other; Cancer in her brother, sister, and sister; Epilepsy in her other; Heart attack in her father and other; Heart disease (age of  onset: 95) in her brother; Heart failure in her mother; Hypertension in her father; Kidney disease in her sister; Myasthenia gravis in her other; Prostate cancer in her other; Seizures in her brother and sister; Thyroid cancer in her other.    Current Meds  Medication Sig  . ascorbic acid (VITAMIN C) 500 MG tablet Take 500 mg by mouth daily.  Marland Kitchen aspirin 81 MG tablet Take 81 mg by mouth daily.  . Biotin 10 MG CAPS Take by mouth.  . Calcium Carb-Cholecalciferol (CALCIUM CARBONATE-VITAMIN D3)  600-400 MG-UNIT TABS Take 1 tablet by mouth daily.  . Multiple Vitamin (MULTIVITAMIN) tablet Take 1 tablet by mouth daily.  . Omega 3 1000 MG CAPS Take 1 capsule by mouth daily.   Marland Kitchen pyridoxine (B-6) 100 MG tablet Take 100 mg by mouth daily.  . valACYclovir (VALTREX) 1000 MG tablet   . vitamin B-12 (CYANOCOBALAMIN) 1000 MCG tablet Take 1,000 mcg by mouth daily.    Patient Care Team: Margarita Rana, MD as PCP - General (Family Medicine)   .MEDST  Objective:   Vitals: BP 116/66 mmHg  Pulse 60  Temp(Src) 98.6 F (37 C) (Oral)  Resp 16  Ht 5\' 3"  (1.6 m)  Wt 153 lb (69.4 kg)  BMI 27.11 kg/m2  Physical Exam  Constitutional: She is oriented to person, place, and time. She appears well-developed and well-nourished.  HENT:  Head: Normocephalic and atraumatic.  Right Ear: Tympanic membrane, external ear and ear canal normal.  Left Ear: Tympanic membrane, external ear and ear canal normal.  Nose: Nose normal.  Mouth/Throat: Uvula is midline, oropharynx is clear and moist and mucous membranes are normal.  Eyes: Conjunctivae, EOM and lids are normal. Pupils are equal, round, and reactive to light.  Neck: Trachea normal and normal range of motion. Neck supple. Carotid bruit is not present. No thyroid mass and no thyromegaly present.  Cardiovascular: Normal rate, regular rhythm and normal heart sounds.   Pulmonary/Chest: Effort normal and breath sounds normal.  Abdominal: Soft. Normal appearance and bowel sounds are normal. There is no hepatosplenomegaly. There is no tenderness.  Musculoskeletal: Normal range of motion.  Lymphadenopathy:    She has no cervical adenopathy.    She has no axillary adenopathy.  Neurological: She is alert and oriented to person, place, and time. She has normal strength. No cranial nerve deficit.  Skin: Skin is warm, dry and intact.  Psychiatric: She has a normal mood and affect. Her speech is normal and behavior is normal. Judgment and thought content normal.  Cognition and memory are normal.    Activities of Daily Living In your present state of health, do you have any difficulty performing the following activities: 03/20/2016  Hearing? N  Vision? N  Difficulty concentrating or making decisions? N  Walking or climbing stairs? N  Dressing or bathing? N  Doing errands, shopping? N    Fall Risk Assessment Fall Risk  03/20/2016  Falls in the past year? No     Depression Screen PHQ 2/9 Scores 03/20/2016  PHQ - 2 Score 0    Cognitive Testing - 6-CIT  Correct? Score   What year is it? yes 0 0 or 4  What month is it? yes 0 0 or 3  Memorize:    Pia Mau,  44,  [place],      What time is it? (within 1 hour) yes 0 0 or 3  Count backwards from 20 yes 0 0, 2, or 4  Name  the months of the year yes 0 0, 2, or 4  Repeat name & address above no 4 0, 2, 4, 6, 8, or 10       TOTAL SCORE  6/28   Interpretation:  Normal  Normal (0-7) Abnormal (8-28)       Assessment & Plan:     Annual Wellness Visit  Reviewed patient's Family Medical History Reviewed and updated list of patient's medical providers Assessment of cognitive impairment was done Assessed patient's functional ability Established a written schedule for health screening [place] Completed and Reviewed  Exercise Activities and Dietary recommendations Goals    None      Immunization History  Administered Date(s) Administered  . Influenza, High Dose Seasonal PF 07/21/2015  . Pneumococcal Conjugate-13 08/26/2014  . Pneumococcal Polysaccharide-23 08/11/1999  . Td 05/04/1997  . Tdap 08/26/2014  . Zoster 02/12/2012   ------------------------------------------------------------------------------------------------------------ 1. Medicare annual wellness visit, subsequent Stable. Patient advised to continue eating healthy and exercise daily.  2. Hypercholesterolemia F/U pending lab report. - CBC with Differential/Platelet -  Comprehensive metabolic panel - Lipid Panel With LDL/HDL Ratio - TSH   Patient seen and examined by Dr. Jerrell Belfast, and note scribed by Philbert Riser. [place], CMA.  I have reviewed the document for accuracy and completeness and I agree with above. - Jerrell Belfast, MD  Margarita Rana, MD  [facility]

## 2016-03-21 DIAGNOSIS — E7800 Pure hypercholesterolemia, unspecified: Secondary | ICD-10-CM | POA: Diagnosis not present

## 2016-03-22 LAB — CBC WITH DIFFERENTIAL/PLATELET
BASOS ABS: 0 10*3/uL (ref 0.0–0.2)
BASOS: 0 %
EOS (ABSOLUTE): 0.2 10*3/uL (ref 0.0–0.4)
Eos: 3 %
HEMOGLOBIN: 13.8 g/dL (ref 11.1–15.9)
Hematocrit: 42.4 % (ref 34.0–46.6)
IMMATURE GRANS (ABS): 0 10*3/uL (ref 0.0–0.1)
Immature Granulocytes: 0 %
LYMPHS: 31 %
Lymphocytes Absolute: 1.9 10*3/uL (ref 0.7–3.1)
MCH: 27.4 pg (ref 26.6–33.0)
MCHC: 32.5 g/dL (ref 31.5–35.7)
MCV: 84 fL (ref 79–97)
MONOCYTES: 9 %
Monocytes Absolute: 0.5 10*3/uL (ref 0.1–0.9)
NEUTROS ABS: 3.5 10*3/uL (ref 1.4–7.0)
Neutrophils: 57 %
Platelets: 255 10*3/uL (ref 150–379)
RBC: 5.04 x10E6/uL (ref 3.77–5.28)
RDW: 15.8 % — ABNORMAL HIGH (ref 12.3–15.4)
WBC: 6.1 10*3/uL (ref 3.4–10.8)

## 2016-03-22 LAB — LIPID PANEL WITH LDL/HDL RATIO
Cholesterol, Total: 212 mg/dL — ABNORMAL HIGH (ref 100–199)
HDL: 68 mg/dL (ref >39)
LDL CALC: 128 mg/dL — AB (ref 0–99)
LDl/HDL Ratio: 1.9 ratio units (ref 0.0–3.2)
Triglycerides: 79 mg/dL (ref 0–149)
VLDL CHOLESTEROL CAL: 16 mg/dL (ref 5–40)

## 2016-03-22 LAB — COMPREHENSIVE METABOLIC PANEL
ALBUMIN: 4.4 g/dL (ref 3.5–4.8)
ALT: 17 IU/L (ref 0–32)
AST: 23 IU/L (ref 0–40)
Albumin/Globulin Ratio: 2.1 (ref 1.2–2.2)
Alkaline Phosphatase: 79 IU/L (ref 39–117)
BILIRUBIN TOTAL: 0.4 mg/dL (ref 0.0–1.2)
BUN / CREAT RATIO: 15 (ref 12–28)
BUN: 12 mg/dL (ref 8–27)
CHLORIDE: 102 mmol/L (ref 96–106)
CO2: 27 mmol/L (ref 18–29)
CREATININE: 0.78 mg/dL (ref 0.57–1.00)
Calcium: 9.9 mg/dL (ref 8.7–10.3)
GFR, EST AFRICAN AMERICAN: 85 mL/min/{1.73_m2} (ref >59)
GFR, EST NON AFRICAN AMERICAN: 74 mL/min/{1.73_m2} (ref >59)
GLUCOSE: 96 mg/dL (ref 65–99)
Globulin, Total: 2.1 g/dL (ref 1.5–4.5)
Potassium: 5.2 mmol/L (ref 3.5–5.2)
Sodium: 143 mmol/L (ref 134–144)
TOTAL PROTEIN: 6.5 g/dL (ref 6.0–8.5)

## 2016-03-22 LAB — TSH: TSH: 5.03 u[IU]/mL — AB (ref 0.450–4.500)

## 2016-03-23 ENCOUNTER — Telehealth: Payer: Self-pay

## 2016-03-23 NOTE — Telephone Encounter (Signed)
Pt advised.   Thanks,   -Schon Zeiders  

## 2016-03-23 NOTE — Telephone Encounter (Signed)
-----   Message from Margarita Rana, MD sent at 03/22/2016  1:15 PM EDT ----- Labs stable. Please notify patient.  Thanks.

## 2016-06-21 ENCOUNTER — Encounter: Payer: Self-pay | Admitting: Physician Assistant

## 2016-06-21 ENCOUNTER — Ambulatory Visit (INDEPENDENT_AMBULATORY_CARE_PROVIDER_SITE_OTHER): Payer: Medicare Other | Admitting: Physician Assistant

## 2016-06-21 VITALS — BP 104/68 | HR 74 | Temp 98.4°F | Resp 18 | Wt 152.0 lb

## 2016-06-21 DIAGNOSIS — J449 Chronic obstructive pulmonary disease, unspecified: Secondary | ICD-10-CM

## 2016-06-21 DIAGNOSIS — R062 Wheezing: Secondary | ICD-10-CM | POA: Diagnosis not present

## 2016-06-21 DIAGNOSIS — R0602 Shortness of breath: Secondary | ICD-10-CM | POA: Diagnosis not present

## 2016-06-21 DIAGNOSIS — R918 Other nonspecific abnormal finding of lung field: Secondary | ICD-10-CM | POA: Diagnosis not present

## 2016-06-21 MED ORDER — FLUTICASONE FUROATE-VILANTEROL 100-25 MCG/INH IN AEPB: 1.0000 | INHALATION_SPRAY | Freq: Every day | RESPIRATORY_TRACT | 0 refills | Status: DC

## 2016-06-21 NOTE — Progress Notes (Signed)
Patient: Carla Sanchez Female    DOB: 1936/06/24   80 y.o.   MRN: [id number] Visit Date: 06/21/2016  Today's Provider: [name], PA-C   Chief Complaint  Patient presents with  . Shortness of Breath   Subjective:    HPI Patient reports that she has difficulty breathing more than usual recently. Patient reports that she was told that she has a lung nodule in the past and she is afraid that hers may be interfering with her breathing. Reports that her shortness of breath is worse in the mornings. Patient describes it as a tightness in her chest. She reports that sometimes she can feel the tightness in her upper back at times. Patient reports that she also has a cough that comes and goes. She reports that this has been worsening since a recent vacation to [place], [place]. She denies any chest pain or tachycardia.  Of note her past medical history is significant for having a surgery on her left lung when she was 80 years old that involve having a rib removed as well. She does have known scarring of the left lung most likely secondary to this. She does have a history of pneumonia a few years ago. She has had a few CT scans that have shown lung nodules in the right lung that have been stable since 2012 and one nodule in the left lung that resolved during 2014. She does also have signs of COPD that was noted on a chest x-ray in January 2017. She has never had a pulmonary function test.    Allergies  Allergen Reactions  . Celebrex [Celecoxib]      Current Outpatient Prescriptions:  .  ascorbic acid (VITAMIN C) 500 MG tablet, Take 500 mg by mouth daily., Disp: , Rfl:  .  aspirin 81 MG tablet, Take 81 mg by mouth daily., Disp: , Rfl:  .  Biotin 10 MG CAPS, Take by mouth., Disp: , Rfl:  .  Calcium Carb-Cholecalciferol (CALCIUM CARBONATE-VITAMIN D3) 600-400 MG-UNIT TABS, Take 1 tablet by mouth daily., Disp: , Rfl:  .  Multiple Vitamin (MULTIVITAMIN) tablet, Take 1 tablet by  mouth daily., Disp: , Rfl:  .  Omega 3 1000 MG CAPS, Take 1 capsule by mouth daily. , Disp: , Rfl:  .  pyridoxine (B-6) 100 MG tablet, Take 100 mg by mouth daily., Disp: , Rfl:  .  valACYclovir (VALTREX) 1000 MG tablet, , Disp: , Rfl:  .  vitamin B-12 (CYANOCOBALAMIN) 1000 MCG tablet, Take 1,000 mcg by mouth daily., Disp: , Rfl:   Review of Systems  Constitutional: Positive for fatigue. Negative for activity change, appetite change, chills, diaphoresis, fever and unexpected weight change.  HENT: Negative for congestion, ear pain, postnasal drip, rhinorrhea, sinus pressure, sneezing, sore throat, tinnitus, trouble swallowing and voice change.   Eyes: Negative for visual disturbance.  Respiratory: Positive for cough, chest tightness, shortness of breath and wheezing. Negative for apnea and choking.   Cardiovascular: Negative for chest pain, palpitations and leg swelling.  Gastrointestinal: Negative for abdominal pain.  Neurological: Negative for dizziness, light-headedness and headaches.    Social History  Substance Use Topics  . Smoking status: Never Smoker  . Smokeless tobacco: Never Used  . Alcohol use No   Objective:   BP 104/68 (BP Location: Right Arm, Patient Position: Sitting, Cuff Size: Normal)   Pulse 74   Temp 98.4 F (36.9 C)   Resp 18   Wt 152 lb (68.9 kg)  SpO2 94%   BMI 26.93 kg/m   Physical Exam  Constitutional: She appears well-developed and well-nourished. No distress.  Neck: Normal range of motion. Neck supple. No JVD present. No tracheal deviation present. No thyromegaly present.  Cardiovascular: Normal rate, regular rhythm and normal heart sounds.  Exam reveals no gallop and no friction rub.   No murmur heard. Pulmonary/Chest: Effort normal. No respiratory distress. She has wheezes (throughout). She has no rales.  Musculoskeletal: She exhibits no edema.  Lymphadenopathy:    She has no cervical adenopathy.  Skin: She is not diaphoretic.  Vitals  reviewed.      Assessment & Plan:     1. SOB (shortness of breath) Worsening shortness of breath with wheezing today. I do question if it's an acute exacerbation of her COPD. Pulmonary function tests today in the office was normal. I did give her a sample of Breo as below. She did do one inhalation today in the office. She did notice some improvement in her breathing following this treatment. I will have her continue Breo for the 14 day sample as we await results for the chest CT. If chest CT is stable may consider adding a daily inhaler. She was given warning signs of increased shortness of breath, increased productive cough, chest pain or racing heart that she should go to the hospital. She voiced understanding. I will follow-up with her pending the results of the chest CT and see how she is doing with the [facility]. - CT Chest Wo Contrast; Future - Spirometry with graph  2. Chronic obstructive pulmonary disease, unspecified COPD type ([place]) See above medical treatment plan. - CT Chest Wo Contrast; Future - fluticasone furoate-vilanterol (BREO ELLIPTA) 100-25 MCG/INH AEPB; Inhale 1 puff into the lungs daily.  Dispense: 14 each; Refill: 0  3. Multiple lung nodules on CT History of lung nodules in the right lung noted on chest CTs from 2012 and 2014. Lung nodule on the left resolved on October 2014 chest CT. - CT Chest Wo Contrast; Future  4. Wheezing See above medical treatment plan. - fluticasone furoate-vilanterol (BREO ELLIPTA) 100-25 MCG/INH AEPB; Inhale 1 puff into the lungs daily.  Dispense: 14 each; Refill: 0       [name], PA-C  [facility]

## 2016-06-21 NOTE — Patient Instructions (Signed)
Fluticasone; Vilanterol inhalation powder What is this medicine? FLUTICASONE; VILANTEROL (floo TIK a sone; vye LAN ter ol) inhalation is a combination of two medicines that decrease inflammation and help to open up the airways of your lungs. It is for chronic obstructive pulmonary disease (COPD), including chronic bronchitis or emphysema. It is also used for asthma in adults to help control symptoms. Do NOT use for an acute asthma attack or COPD attack. This medicine may be used for other purposes; ask your health care provider or pharmacist if you have questions. What should I tell my health care provider before I take this medicine? They need to know if you have any of these conditions: -bone problems -immune system problems -diabetes -heart disease or irregular heartbeat -high blood pressure -infection -pheochromocytoma -seizures -thyroid disease -an unusual or allergic reaction to fluticasone, vilanterol, milk proteins, corticosteroids, other medicines, foods, dyes, or preservatives -pregnant or trying to get pregnant -breast-feeding How should I use this medicine? This medicine is inhaled through the mouth. It is used once per day. Follow the directions on the prescription label. Do not use a spacer device with this inhaler. Take your medicine at regular intervals. Do not take your medicine more often than directed. Do not stop taking except on your doctor's advice. Make sure that you are using your inhaler correctly. Ask you doctor or health care provider if you have any questions. A special MedGuide will be given to you by the pharmacist with each prescription and refill. Be sure to read this information carefully each time. Talk to your pediatrician regarding the use of this medicine in children. Special care may be needed. This medicine is not approved for use in children under 63 years of age. Overdosage: If you think you have taken too much of this medicine contact a poison control  center or emergency room at once. NOTE: This medicine is only for you. Do not share this medicine with others. What if I miss a dose? If you miss a dose, use it as soon as you can. If it is almost time for your next dose, use only that dose and continue with your regular schedule. Do not use double or extra doses. What may interact with this medicine? Do not take this medicine with any of the following medications: -cisapride -dofetilide -dronedarone -MAOIs like Carbex, Eldepryl, Marplan, Nardil, and Parnate -pimozide -thioridazine -ziprasidone This medicine may also interact with the following medications: -antiviral medicines for HIV or AIDS -beta-blockers like metoprolol and propranolol -certain medicines for depression, anxiety, or psychotic disturbances -diuretics -medicines for colds -medicines for fungal infections like ketoconazole and itraconazole -other medicines for breathing problems -other medicines that prolong the QT interval (cause an abnormal heart rhythm) This list may not describe all possible interactions. Give your health care provider a list of all the medicines, herbs, non-prescription drugs, or dietary supplements you use. Also tell them if you smoke, drink alcohol, or use illegal drugs. Some items may interact with your medicine. What should I watch for while using this medicine? Visit your doctor or health care professional for regular checkups. Tell your doctor or health care professional if your symptoms do not get better. If your symptoms get worse or if you need your short-acting inhalers more often, call your doctor right away. Do not use this medicine more than every 24 hours. If you are going to have surgery tell your doctor or health care professional that you are using this medicine. Try not to come in contact with  people with the chicken pox or measles. If you do, call your doctor. What side effects may I notice from receiving this medicine? Side  effects that you should report to your doctor or health care professional as soon as possible: -allergic reactions like skin rash or hives, swelling of the face, lips, or tongue -breathing problems right after inhaling your medicine -changes in vision -chest pain -fast, irregular heartbeat -feeling faint or lightheaded, falls -fever or chills -nausea, vomiting -tiredness Side effects that usually do not require medical attention (Report these to your doctor or health care professional if they continue or are bothersome.): -cough -headache -nervousness -sore throat -tremor This list may not describe all possible side effects. Call your doctor for medical advice about side effects. You may report side effects to FDA at 1-800-FDA-1088. Where should I keep my medicine? Keep out of the reach of children. Store at room temperature between 15 and 30 degrees C (59 and 86 degrees F). Store in a dry place away from direct heat or sunlight. Throw away 6 weeks after you remove the inhaler from the foil tray, or after the dose indicator reads 0, whichever comes first. Throw away any unopened packages after the expiration date. NOTE: This sheet is a summary. It may not cover all possible information. If you have questions about this medicine, talk to your doctor, pharmacist, or health care provider.    2016, Elsevier/Gold Standard. (2014-03-23 15:06:05)

## 2016-06-30 ENCOUNTER — Telehealth: Payer: Self-pay

## 2016-06-30 ENCOUNTER — Ambulatory Visit
Admission: RE | Admit: 2016-06-30 | Discharge: 2016-06-30 | Disposition: A | Discharge status: Home / Self Care | Payer: Medicare Other | Source: Ambulatory Visit | Attending: Physician Assistant | Admitting: Physician Assistant

## 2016-06-30 DIAGNOSIS — R0602 Shortness of breath: Secondary | ICD-10-CM | POA: Diagnosis not present

## 2016-06-30 DIAGNOSIS — Z9889 Other specified postprocedural states: Secondary | ICD-10-CM | POA: Diagnosis not present

## 2016-06-30 DIAGNOSIS — J449 Chronic obstructive pulmonary disease, unspecified: Secondary | ICD-10-CM

## 2016-06-30 DIAGNOSIS — R918 Other nonspecific abnormal finding of lung field: Secondary | ICD-10-CM | POA: Diagnosis not present

## 2016-06-30 MED ORDER — FLUTICASONE FUROATE-VILANTEROL 100-25 MCG/INH IN AEPB: 1.0000 | INHALATION_SPRAY | Freq: Every day | RESPIRATORY_TRACT | 3 refills | Status: DC

## 2016-06-30 NOTE — Telephone Encounter (Signed)
Yes if it is working for her

## 2016-06-30 NOTE — Telephone Encounter (Signed)
Rx sent in

## 2016-06-30 NOTE — Telephone Encounter (Signed)
-----   Message from [name], [place] sent at 06/30/2016  4:14 PM EDT ----- Pulmonary nodules are stable and unchanged. Stable chest CT

## 2016-06-30 NOTE — Telephone Encounter (Signed)
Patient reports that she feels that is working for her. We do not have any more samples for Breo. Patient uses [facility]

## 2016-06-30 NOTE — Telephone Encounter (Signed)
Patient advised as directed below. Patient voiced understanding.   Patient is also requesting another sample of Breo inhaler. Is it ok to give her another one. You gave her one on the 38.

## 2016-07-28 ENCOUNTER — Ambulatory Visit (INDEPENDENT_AMBULATORY_CARE_PROVIDER_SITE_OTHER): Payer: Medicare Other | Admitting: Family Medicine

## 2016-07-28 DIAGNOSIS — Z23 Encounter for immunization: Secondary | ICD-10-CM | POA: Diagnosis not present

## 2016-11-13 ENCOUNTER — Ambulatory Visit (INDEPENDENT_AMBULATORY_CARE_PROVIDER_SITE_OTHER): Payer: Medicare Other | Admitting: Physician Assistant

## 2016-11-13 ENCOUNTER — Encounter: Payer: Self-pay | Admitting: Physician Assistant

## 2016-11-13 VITALS — BP 126/80 | HR 84 | Temp 99.9°F | Resp 20 | Wt 147.0 lb

## 2016-11-13 DIAGNOSIS — J209 Acute bronchitis, unspecified: Secondary | ICD-10-CM | POA: Diagnosis not present

## 2016-11-13 MED ORDER — AZITHROMYCIN 250 MG PO TABS: ORAL_TABLET | ORAL | 0 refills | Status: DC

## 2016-11-13 MED ORDER — PREDNISONE 10 MG PO TABS: ORAL_TABLET | ORAL | 0 refills | Status: DC

## 2016-11-13 MED ORDER — HYDROCODONE-HOMATROPINE 5-1.5 MG/5ML PO SYRP: 5.0000 mL | ORAL_SOLUTION | Freq: Three times a day (TID) | ORAL | 0 refills | Status: DC | PRN

## 2016-11-13 NOTE — Progress Notes (Signed)
Patient: Carla Sanchez Female    DOB: 07-01-1936   81 y.o.   MRN: [id number] Visit Date: 11/13/2016  Today's Provider: [name], PA-C   Chief Complaint  Patient presents with  . URI   Subjective:    HPI Upper Respiratory Infection: Patient complains of symptoms of a URI. Symptoms include cough. Onset of symptoms was 1 week ago, gradually worsening since that time. She also c/o productive cough with  green colored sputum and shortness of breath for the past 1 week .  She is drinking plenty of fluids. Evaluation to date: none. Treatment to date: cough suppressants and decongestants.     Allergies  Allergen Reactions  . Celebrex [Celecoxib]    Patient Active Problem List   Diagnosis Date Noted  . Chest pain 10/15/2015  . Allergic rhinitis 10/13/2015  . Abnormal CAT scan 10/13/2015  . Calcification of intervertebral cartilage or disc of cervical region 10/13/2015  . Chronic constipation 10/13/2015  . H/O breast augmentation 10/13/2015  . Calcium blood increased 10/13/2015  . Hypercholesterolemia 10/13/2015  . Lung mass 10/13/2015  . 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 Shortness of breath 10/13/2015  . Abnormal echocardiogram 02/23/2015  . Hypothyroidism due to fibrous invasive thyroiditis 02/23/2015  . CMC arthritis, thumb, degenerative 10/13/2014  . OP (osteoporosis) 09/30/2009  . Benign neoplasm of colon 07/08/2003  . Benign neoplasm of large bowel 07/08/2003  . Colon, diverticulosis 07/08/2003  . Adaptive colitis 07/08/2003  . Malignant melanoma of skin of lower limb, including hip ([place]) 09/21/1994  . Disorder of mitral valve 08/02/1993      Current Outpatient Prescriptions:  .  ascorbic acid (VITAMIN C) 500 MG tablet, Take 500 mg by mouth daily., Disp: , Rfl:  .  aspirin 81 MG tablet, Take 81 mg by mouth daily., Disp: , Rfl:  .  Biotin 10 MG CAPS, Take by mouth., Disp: , Rfl:  .  Calcium Carb-Cholecalciferol (CALCIUM CARBONATE-VITAMIN D3) 600-400 MG-UNIT  TABS, Take 1 tablet by mouth daily., Disp: , Rfl:  .  fluticasone furoate-vilanterol (BREO ELLIPTA) 100-25 MCG/INH AEPB, Inhale 1 puff into the lungs daily., Disp: 60 each, Rfl: 3 .  Multiple Vitamin (MULTIVITAMIN) tablet, Take 1 tablet by mouth daily., Disp: , Rfl:  .  Omega 3 1000 MG CAPS, Take 1 capsule by mouth daily. , Disp: , Rfl:  .  pyridoxine (B-6) 100 MG tablet, Take 100 mg by mouth daily., Disp: , Rfl:  .  valACYclovir (VALTREX) 1000 MG tablet, , Disp: , Rfl:  .  vitamin B-12 (CYANOCOBALAMIN) 1000 MCG tablet, Take 1,000 mcg by mouth daily., Disp: , Rfl:   Review of Systems  Constitutional: Negative.   HENT: Positive for congestion, postnasal drip, rhinorrhea and sore throat. Negative for ear pain, sinus pain, sinus pressure, sneezing and tinnitus.   Respiratory: Positive for cough, chest tightness, shortness of breath and wheezing.   Cardiovascular: Negative for chest pain, palpitations and leg swelling.  Gastrointestinal: Negative for abdominal pain and nausea.  Neurological: Negative for dizziness and headaches.    Social History  Substance Use Topics  . Smoking status: Never Smoker  . Smokeless tobacco: Never Used  . Alcohol use No   Objective:   BP 126/80 (BP Location: Left Arm, Patient Position: Sitting, Cuff Size: Large)   Pulse 84   Temp 99.9 F (37.7 C) (Oral)   Resp 20   Wt 147 lb (66.7 kg)   SpO2 96%   BMI 26.04 kg/m   Physical  Exam  Constitutional: She appears well-developed and well-nourished. No distress.  HENT:  Head: Normocephalic and atraumatic.  Right Ear: Hearing, tympanic membrane, external ear and ear canal normal.  Left Ear: Hearing, tympanic membrane, external ear and ear canal normal.  Nose: Nose normal.  Mouth/Throat: Uvula is midline, oropharynx is clear and moist and mucous membranes are normal. No oropharyngeal exudate.  Eyes: Conjunctivae are normal. Pupils are equal, round, and reactive to light. Right eye exhibits no discharge. Left  eye exhibits no discharge. No scleral icterus.  Neck: Normal range of motion. Neck supple. No tracheal deviation present. No thyromegaly present.  Cardiovascular: Normal rate, regular rhythm and normal heart sounds.  Exam reveals no gallop and no friction rub.   No murmur heard. Pulmonary/Chest: Effort normal. No stridor. No respiratory distress. She has no decreased breath sounds. She has wheezes (scattered throughout). She has no rhonchi. She has no rales.  Lymphadenopathy:    She has no cervical adenopathy.  Skin: Skin is warm and dry. She is not diaphoretic.  Vitals reviewed.       Assessment & Plan:     1. Acute bronchitis, unspecified organism Worsening symptoms. Will give zpak and prednisone as below. Hycodan cough syrup given for nighttime cough. Mucinex DM can be used for daytime cough. She is to call the office if symptoms worsen. - azithromycin (ZITHROMAX) 250 MG tablet; Take 2 tablets PO on day one, and one tablet PO daily thereafter until completed.  Dispense: 6 tablet; Refill: 0 - predniSONE (DELTASONE) 10 MG tablet; Take 6 tabs PO on day 1&2, 5 tabs PO on day 3&4, 4 tabs PO on day 5&6, 3 tabs PO on day 7&8, 2 tabs PO on day 9&10, 1 tab PO on day 11&12.  Dispense: 42 tablet; Refill: 0 - HYDROcodone-homatropine (HYCODAN) 5-1.5 MG/5ML syrup; Take 5 mLs by mouth every 8 (eight) hours as needed for cough.  Dispense: 120 mL; Refill: 0       [name], PA-C  [facility]

## 2016-11-13 NOTE — Patient Instructions (Signed)

## 2016-12-12 ENCOUNTER — Telehealth: Payer: Self-pay | Admitting: Family Medicine

## 2016-12-12 NOTE — Telephone Encounter (Signed)
Called Pt to schedule AWV with [facility] - knb °

## 2016-12-14 NOTE — Telephone Encounter (Signed)
Pt's last AWV was 03/20/2016. Renaldo Fiddler, CMA

## 2017-01-01 DIAGNOSIS — Z9841 Cataract extraction status, right eye: Secondary | ICD-10-CM | POA: Diagnosis not present

## 2017-01-01 DIAGNOSIS — H52223 Regular astigmatism, bilateral: Secondary | ICD-10-CM | POA: Diagnosis not present

## 2017-01-01 DIAGNOSIS — H04123 Dry eye syndrome of bilateral lacrimal glands: Secondary | ICD-10-CM | POA: Diagnosis not present

## 2017-01-01 DIAGNOSIS — H16223 Keratoconjunctivitis sicca, not specified as Sjogren's, bilateral: Secondary | ICD-10-CM | POA: Diagnosis not present

## 2017-01-01 DIAGNOSIS — Z9842 Cataract extraction status, left eye: Secondary | ICD-10-CM | POA: Diagnosis not present

## 2017-03-08 DIAGNOSIS — Z85820 Personal history of malignant melanoma of skin: Secondary | ICD-10-CM | POA: Diagnosis not present

## 2017-03-08 DIAGNOSIS — L570 Actinic keratosis: Secondary | ICD-10-CM | POA: Diagnosis not present

## 2017-03-08 DIAGNOSIS — L821 Other seborrheic keratosis: Secondary | ICD-10-CM | POA: Diagnosis not present

## 2017-03-08 DIAGNOSIS — X32XXXA Exposure to sunlight, initial encounter: Secondary | ICD-10-CM | POA: Diagnosis not present

## 2017-03-08 DIAGNOSIS — D485 Neoplasm of uncertain behavior of skin: Secondary | ICD-10-CM | POA: Diagnosis not present

## 2017-03-08 DIAGNOSIS — Z08 Encounter for follow-up examination after completed treatment for malignant neoplasm: Secondary | ICD-10-CM | POA: Diagnosis not present

## 2017-03-08 DIAGNOSIS — D0411 Carcinoma in situ of skin of right eyelid, including canthus: Secondary | ICD-10-CM | POA: Diagnosis not present

## 2017-03-08 DIAGNOSIS — Z85828 Personal history of other malignant neoplasm of skin: Secondary | ICD-10-CM | POA: Diagnosis not present

## 2017-03-15 ENCOUNTER — Encounter: Payer: Self-pay | Admitting: Physician Assistant

## 2017-03-15 ENCOUNTER — Ambulatory Visit (INDEPENDENT_AMBULATORY_CARE_PROVIDER_SITE_OTHER): Payer: Medicare Other | Admitting: Physician Assistant

## 2017-03-15 VITALS — BP 108/60 | HR 80 | Temp 97.7°F | Resp 20 | Wt 152.0 lb

## 2017-03-15 DIAGNOSIS — M940 Chondrocostal junction syndrome [Tietze]: Secondary | ICD-10-CM | POA: Diagnosis not present

## 2017-03-15 MED ORDER — PREDNISONE 10 MG (21) PO TBPK: ORAL_TABLET | ORAL | 0 refills | Status: DC

## 2017-03-15 NOTE — Progress Notes (Signed)
Patient: Carla Sanchez Female    DOB: Apr 25, 1936   81 y.o.   MRN: [id number] Visit Date: 03/15/2017  Today's Provider: [name], PA-C   Chief Complaint  Patient presents with  . Abdominal Pain   Subjective:    Abdominal Pain  This is a new problem. Episode onset: x 2 weeks. The onset quality is sudden. The problem has been waxing and waning. The pain is located in the RUQ (right side pain). The quality of the pain is sharp. The abdominal pain does not radiate. Associated symptoms include constipation (chronic). Pertinent negatives include no anorexia, belching, diarrhea, dysuria, fever, flatus, frequency, headaches, hematochezia, hematuria, melena, myalgias, nausea, vomiting or weight loss. Arthralgias: baseline. Exacerbated by: sneezing, coughing, laughing. Relieved by: sleeping on left side. Treatments tried: NSAIDs. The treatment provided moderate relief.   2 weeks ago patient was sitting in her yard and digging up some weeds. She had already been digging for a while, got up to move her chair and started digging again when she noticed the pain. Since it has not progressed but she notices pain with rotating, coughing, sneezing and laughing.     Allergies  Allergen Reactions  . Celebrex [Celecoxib]      Current Outpatient Prescriptions:  .  ascorbic acid (VITAMIN C) 500 MG tablet, Take 500 mg by mouth daily., Disp: , Rfl:  .  aspirin 81 MG tablet, Take 81 mg by mouth daily., Disp: , Rfl:  .  Biotin 10 MG CAPS, Take by mouth., Disp: , Rfl:  .  Calcium Carb-Cholecalciferol (CALCIUM CARBONATE-VITAMIN D3) 600-400 MG-UNIT TABS, Take 1 tablet by mouth daily., Disp: , Rfl:  .  Multiple Vitamin (MULTIVITAMIN) tablet, Take 1 tablet by mouth daily., Disp: , Rfl:  .  Omega 3 1000 MG CAPS, Take 1 capsule by mouth daily. , Disp: , Rfl:  .  pyridoxine (B-6) 100 MG tablet, Take 100 mg by mouth daily., Disp: , Rfl:  .  vitamin B-12 (CYANOCOBALAMIN) 1000 MCG tablet, Take  1,000 mcg by mouth daily., Disp: , Rfl:  .  valACYclovir (VALTREX) 1000 MG tablet, , Disp: , Rfl:   Review of Systems  Constitutional: Negative for fever and weight loss.  Respiratory: Negative for cough, chest tightness, shortness of breath and wheezing.   Cardiovascular: Positive for chest pain. Negative for palpitations and leg swelling.  Gastrointestinal: Positive for constipation (chronic). Negative for abdominal pain, anorexia, diarrhea, flatus, hematochezia, melena, nausea and vomiting.  Genitourinary: Negative for dysuria, frequency and hematuria.  Musculoskeletal: Negative for myalgias. Arthralgias: baseline.  Neurological: Negative for headaches.    Social History  Substance Use Topics  . Smoking status: Never Smoker  . Smokeless tobacco: Never Used  . Alcohol use No   Objective:   BP 108/60 (BP Location: Right Arm, Patient Position: Sitting, Cuff Size: Normal)   Pulse 80   Temp 97.7 F (36.5 C) (Oral)   Resp 20   Wt 152 lb (68.9 kg)   BMI 26.93 kg/m  Vitals:   03/15/17 1338  BP: 108/60  Pulse: 80  Resp: 20  Temp: 97.7 F (36.5 C)  TempSrc: Oral  Weight: 152 lb (68.9 kg)     Physical Exam  Constitutional: She appears well-developed and well-nourished. No distress.  Neck: Normal range of motion. Neck supple.  Cardiovascular: Normal rate, regular rhythm and normal heart sounds.  Exam reveals no gallop and no friction rub.   No murmur heard. Pulmonary/Chest: Effort normal and breath sounds  normal. No respiratory distress. She has no wheezes. She has no rales. She exhibits tenderness.    Skin: She is not diaphoretic.  Vitals reviewed.       Assessment & Plan:     1. Costochondritis, acute Suspect costochondritis vs intercostal muscle strain. Will treat with prednisone as below. Stop other NSAIDs until completed. Discussed deep breathing exercises to prevent pneumonia. She is to call if no improvement. - predniSONE (STERAPRED UNI-PAK 21 TAB) 10 MG (21)  TBPK tablet; Take as directed on package directions  Dispense: 21 tablet; Refill: 0       [name], PA-C  [facility]

## 2017-03-15 NOTE — Patient Instructions (Signed)
Costochondritis Costochondritis is swelling and irritation (inflammation) of the tissue (cartilage) that connects your ribs to your breastbone (sternum). This causes pain in the front of your chest. The pain usually starts gradually and involves more than one rib. What are the causes? The exact cause of this condition is not always known. It results from stress on the cartilage where your ribs attach to your sternum. The cause of this stress could be:  Chest injury (trauma).  Exercise or activity, such as lifting.  Severe coughing.  What increases the risk? You may be at higher risk for this condition if you:  Are female.  Are 30?79 years old.  Recently started a new exercise or work activity.  Have low levels of vitamin D.  Have a condition that makes you cough frequently.  What are the signs or symptoms? The main symptom of this condition is chest pain. The pain:  Usually starts gradually and can be sharp or dull.  Gets worse with deep breathing, coughing, or exercise.  Gets better with rest.  May be worse when you press on the sternum-rib connection (tenderness).  How is this diagnosed? This condition is diagnosed based on your symptoms, medical history, and a physical exam. Your health care provider will check for tenderness when pressing on your sternum. This is the most important finding. You may also have tests to rule out other causes of chest pain. These may include:  A chest X-ray to check for lung problems.  An electrocardiogram (ECG) to see if you have a heart problem that could be causing the pain.  An imaging scan to rule out a chest or rib fracture.  How is this treated? This condition usually goes away on its own over time. Your health care provider may prescribe an NSAID to reduce pain and inflammation. Your health care provider may also suggest that you:  Rest and avoid activities that make pain worse.  Apply heat or cold to the area to reduce pain  and inflammation.  Do exercises to stretch your chest muscles.  If these treatments do not help, your health care provider may inject a numbing medicine at the sternum-rib connection to help relieve the pain. Follow these instructions at home:  Avoid activities that make pain worse. This includes any activities that use chest, abdominal, and side muscles.  If directed, put ice on the painful area: ? Put ice in a plastic bag. ? Place a towel between your skin and the bag. ? Leave the ice on for 20 minutes, 2-3 times a day.  If directed, apply heat to the affected area as often as told by your health care provider. Use the heat source that your health care provider recommends, such as a moist heat pack or a heating pad. ? Place a towel between your skin and the heat source. ? Leave the heat on for 20-30 minutes. ? Remove the heat if your skin turns bright red. This is especially important if you are unable to feel pain, heat, or cold. You may have a greater risk of getting burned.  Take over-the-counter and prescription medicines only as told by your health care provider.  Return to your normal activities as told by your health care provider. Ask your health care provider what activities are safe for you.  Keep all follow-up visits as told by your health care provider. This is important. Contact a health care provider if:  You have chills or a fever.  Your pain does not go   away or it gets worse.  You have a cough that does not go away (is persistent). Get help right away if:  You have shortness of breath. This information is not intended to replace advice given to you by your health care provider. Make sure you discuss any questions you have with your health care provider. Document Released: 07/05/2005 Document Revised: 04/14/2016 Document Reviewed: 01/19/2016 Elsevier Interactive Patient Education  2018 [employer]vier [employer].   

## 2017-03-21 ENCOUNTER — Other Ambulatory Visit: Payer: Self-pay | Admitting: Physician Assistant

## 2017-03-21 DIAGNOSIS — M940 Chondrocostal junction syndrome [Tietze]: Secondary | ICD-10-CM

## 2017-03-21 MED ORDER — PREDNISONE 10 MG (21) PO TBPK: ORAL_TABLET | ORAL | 0 refills | Status: DC

## 2017-03-21 NOTE — Telephone Encounter (Signed)
Pt contacted office for refill request on the following medications: predniSONE (STERAPRED UNI-PAK 21 TAB) 10 MG (21) TBPK tablet [facility] [address]. Pt stated that Tawanna Sat advised pt to call back if she felt she needed another round of the medication. Pt stated that she is going out of town and would like this sent in today if possible. Please advise. Thanks TNP

## 2017-03-21 NOTE — Telephone Encounter (Signed)
Sent in to [facility]

## 2017-03-21 NOTE — Telephone Encounter (Signed)
Patient advised.

## 2017-04-06 ENCOUNTER — Ambulatory Visit (INDEPENDENT_AMBULATORY_CARE_PROVIDER_SITE_OTHER): Payer: Medicare Other

## 2017-04-06 VITALS — BP 118/72 | HR 64 | Temp 98.8°F | Ht 63.0 in | Wt 151.6 lb

## 2017-04-06 DIAGNOSIS — Z0000 Encounter for general adult medical examination without abnormal findings: Secondary | ICD-10-CM | POA: Diagnosis not present

## 2017-04-06 NOTE — Patient Instructions (Signed)
Carla Sanchez , Thank you for taking time to come for your Medicare Wellness Visit. I appreciate your ongoing commitment to your health goals. Please review the following plan we discussed and let me know if I can assist you in the future.   Screening recommendations/referrals: Colonoscopy: completed 03/29/15 Mammogram: completed 05/26/14 Bone Density: completed 10/14/14 Recommended yearly ophthalmology/optometry visit for glaucoma screening and checkup Recommended yearly dental visit for hygiene and checkup   Vaccinations: Influenza vaccine: due 06/2017 Pneumococcal vaccine: Prevnar 13 given 08/26/14, denied Pneumovax 23 today. Pt would like to discuss this further with [facility]. Tdap vaccine: completed 08/26/14, due 08/2024 Shingles vaccine: completed 02/12/12   Advanced directives: Please bring a copy of your POA (Power of Attorney) and/or Living Will to your next appointment.   Conditions/risks identified: Recommend increasing water intake to 4-6 glasses a day.   Next appointment: 04/09/17 @ 1:30 PM   Preventive Care 65 Years and Older, Female Preventive care refers to lifestyle choices and visits with your health care provider that can promote health and wellness. What does preventive care include?  A yearly physical exam. This is also called an annual well check.  Dental exams once or twice a year.  Routine eye exams. Ask your health care provider how often you should have your eyes checked.  Personal lifestyle choices, including:  Daily care of your teeth and gums.  Regular physical activity.  Eating a healthy diet.  Avoiding tobacco and drug use.  Limiting alcohol use.  Practicing safe sex.  Taking low-dose aspirin every day.  Taking vitamin and mineral supplements as recommended by your health care provider. What happens during an annual well check? The services and screenings done by your health care provider during your annual well check will depend on your  age, overall health, lifestyle risk factors, and family history of disease. Counseling  Your health care provider may ask you questions about your:  Alcohol use.  Tobacco use.  Drug use.  Emotional well-being.  Home and relationship well-being.  Sexual activity.  Eating habits.  History of falls.  Memory and ability to understand (cognition).  Work and work Statistician.  Reproductive health. Screening  You may have the following tests or measurements:  Height, weight, and BMI.  Blood pressure.  Lipid and cholesterol levels. These may be checked every 5 years, or more frequently if you are over 69 years old.  Skin check.  Lung cancer screening. You may have this screening every year starting at age 7 if you have a 30-pack-year history of smoking and currently smoke or have quit within the past 15 years.  Fecal occult blood test (FOBT) of the stool. You may have this test every year starting at age 34.  Flexible sigmoidoscopy or colonoscopy. You may have a sigmoidoscopy every 5 years or a colonoscopy every 10 years starting at age 32.  Hepatitis C blood test.  Hepatitis B blood test.  Sexually transmitted disease (STD) testing.  Diabetes screening. This is done by checking your blood sugar (glucose) after you have not eaten for a while (fasting). You may have this done every 1-3 years.  Bone density scan. This is done to screen for osteoporosis. You may have this done starting at age 36.  Mammogram. This may be done every 1-2 years. Talk to your health care provider about how often you should have regular mammograms. Talk with your health care provider about your test results, treatment options, and if necessary, the need for more tests. Vaccines  Your health care provider may recommend certain vaccines, such as:  Influenza vaccine. This is recommended every year.  Tetanus, diphtheria, and acellular pertussis (Tdap, Td) vaccine. You may need a Td booster  every 10 years.  Zoster vaccine. You may need this after age 31.  Pneumococcal 13-valent conjugate (PCV13) vaccine. One dose is recommended after age 47.  Pneumococcal polysaccharide (PPSV23) vaccine. One dose is recommended after age 60. Talk to your health care provider about which screenings and vaccines you need and how often you need them. This information is not intended to replace advice given to you by your health care provider. Make sure you discuss any questions you have with your health care provider. Document Released: 10/22/2015 Document Revised: 06/14/2016 Document Reviewed: 07/27/2015 Elsevier Interactive Patient Education  2017 [place] Prevention in the Home Falls can cause injuries. They can happen to people of all ages. There are many things you can do to make your home safe and to help prevent falls. What can I do on the outside of my home?  Regularly fix the edges of walkways and driveways and fix any cracks.  Remove anything that might make you trip as you walk through a door, such as a raised step or threshold.  Trim any bushes or trees on the path to your home.  Use bright outdoor lighting.  Clear any walking paths of anything that might make someone trip, such as rocks or tools.  Regularly check to see if handrails are loose or broken. Make sure that both sides of any steps have handrails.  Any raised decks and porches should have guardrails on the edges.  Have any leaves, snow, or ice cleared regularly.  Use sand or salt on walking paths during winter.  Clean up any spills in your garage right away. This includes oil or grease spills. What can I do in the bathroom?  Use night lights.  Install grab bars by the toilet and in the tub and shower. Do not use towel bars as grab bars.  Use non-skid mats or decals in the tub or shower.  If you need to sit down in the shower, use a plastic, non-slip stool.  Keep the floor dry. Clean up any  water that spills on the floor as soon as it happens.  Remove soap buildup in the tub or shower regularly.  Attach bath mats securely with double-sided non-slip rug tape.  Do not have throw rugs and other things on the floor that can make you trip. What can I do in the bedroom?  Use night lights.  Make sure that you have a light by your bed that is easy to reach.  Do not use any sheets or blankets that are too big for your bed. They should not hang down onto the floor.  Have a firm chair that has side arms. You can use this for support while you get dressed.  Do not have throw rugs and other things on the floor that can make you trip. What can I do in the kitchen?  Clean up any spills right away.  Avoid walking on wet floors.  Keep items that you use a lot in easy-to-reach places.  If you need to reach something above you, use a strong step stool that has a grab bar.  Keep electrical cords out of the way.  Do not use floor polish or wax that makes floors slippery. If you must use wax, use non-skid floor wax.  Do  not have throw rugs and other things on the floor that can make you trip. What can I do with my stairs?  Do not leave any items on the stairs.  Make sure that there are handrails on both sides of the stairs and use them. Fix handrails that are broken or loose. Make sure that handrails are as long as the stairways.  Check any carpeting to make sure that it is firmly attached to the stairs. Fix any carpet that is loose or worn.  Avoid having throw rugs at the top or bottom of the stairs. If you do have throw rugs, attach them to the floor with carpet tape.  Make sure that you have a light switch at the top of the stairs and the bottom of the stairs. If you do not have them, ask someone to add them for you. What else can I do to help prevent falls?  Wear shoes that:  Do not have high heels.  Have rubber bottoms.  Are comfortable and fit you well.  Are closed  at the toe. Do not wear sandals.  If you use a stepladder:  Make sure that it is fully opened. Do not climb a closed stepladder.  Make sure that both sides of the stepladder are locked into place.  Ask someone to hold it for you, if possible.  Clearly mark and make sure that you can see:  Any grab bars or handrails.  First and last steps.  Where the edge of each step is.  Use tools that help you move around (mobility aids) if they are needed. These include:  Canes.  Walkers.  Scooters.  Crutches.  Turn on the lights when you go into a dark area. Replace any light bulbs as soon as they burn out.  Set up your furniture so you have a clear path. Avoid moving your furniture around.  If any of your floors are uneven, fix them.  If there are any pets around you, be aware of where they are.  Review your medicines with your doctor. Some medicines can make you feel dizzy. This can increase your chance of falling. Ask your doctor what other things that you can do to help prevent falls. This information is not intended to replace advice given to you by your health care provider. Make sure you discuss any questions you have with your health care provider. Document Released: 07/22/2009 Document Revised: 03/02/2016 Document Reviewed: 10/30/2014 Elsevier Interactive Patient Education  2017 [employer].

## 2017-04-06 NOTE — Progress Notes (Signed)
Subjective:   Carla Sanchez is a 81 y.o. female who presents for Medicare Annual (Subsequent) preventive examination.  Review of Systems:  N/A  Cardiac Risk Factors include: advanced age (>59men, >68 women);dyslipidemia     Objective:     Vitals: BP 118/72 (BP Location: Right Arm)   Pulse 64   Temp 98.8 F (37.1 C) (Oral)   Ht 5\' 3"  (1.6 m)   Wt 151 lb 9.6 oz (68.8 kg)   BMI 26.85 kg/m   Body mass index is 26.85 kg/m.   Tobacco History  Smoking Status  . Never Smoker  Smokeless Tobacco  . Never Used     Counseling given: Not Answered   Past Medical History:  Diagnosis Date  . Arthritis   . Cancer ([place])    Malignant Melanoma  . Hemorrhoids   . Migraines    Past Surgical History:  Procedure Laterality Date  . ABDOMINAL HYSTERECTOMY     in her 50's  . Bladder Tack    . BREAST SURGERY     Bilateral Mastectomy  . COLONOSCOPY WITH PROPOFOL N/A 03/29/2015   Procedure: COLONOSCOPY WITH PROPOFOL;  Surgeon: Lollie Sails, MD;  Location[facility] ENDOSCOPY;  Service: Endoscopy;  Laterality: N/A;  . HAND SURGERY Right 04/2015  . HEMORRHOIDECTOMY WITH HEMORRHOID BANDING    . JOINT REPLACEMENT     RT TKR; LT TKR; RT Total Hip Arthroplasty; LT Hip Arthroplasty  . LUNG SURGERY     at age 65, also removed a rib   Family History  Problem Relation Age of Onset  . Heart failure Mother   . Arthritis Mother   . Hypertension Father   . Heart attack Father   . Seizures Brother   . Seizures Sister   . Kidney disease Sister   . Cancer Sister        lymphoma  . Cancer Sister   . Cancer Brother        prostate  . Heart disease Brother 71       heart attack  . Arthritis Other   . Heart attack Other   . Epilepsy Other   . Thyroid cancer Other   . Myasthenia gravis Other   . Prostate cancer Other    History  Sexual Activity  . Sexual activity: Not on file    Outpatient Encounter Prescriptions as of 04/06/2017  Medication Sig  . ascorbic acid (VITAMIN C)  500 MG tablet Take 500 mg by mouth daily.  Marland Kitchen aspirin 81 MG tablet Take 81 mg by mouth daily.  . Biotin 10 MG CAPS Take by mouth.  . Calcium Carb-Cholecalciferol (CALCIUM CARBONATE-VITAMIN D3) 600-400 MG-UNIT TABS Take 1 tablet by mouth daily.  . Multiple Vitamin (MULTIVITAMIN) tablet Take 1 tablet by mouth daily.  . Omega 3 1000 MG CAPS Take 1 capsule by mouth daily.   Marland Kitchen pyridoxine (B-6) 100 MG tablet Take 100 mg by mouth daily.  . vitamin B-12 (CYANOCOBALAMIN) 1000 MCG tablet Take 1,000 mcg by mouth daily.  . valACYclovir (VALTREX) 1000 MG tablet   . [DISCONTINUED] predniSONE (STERAPRED UNI-PAK 21 TAB) 10 MG (21) TBPK tablet Take as directed on package directions   No facility-administered encounter medications on file as of 04/06/2017.     Activities of Daily Living In your present state of health, do you have any difficulty performing the following activities: 04/06/2017  Hearing? N  Vision? N  Difficulty concentrating or making decisions? N  Walking or climbing stairs? N  Dressing  or bathing? N  Doing errands, shopping? N  Preparing Food and eating ? N  Using the Toilet? N  In the past six months, have you accidently leaked urine? N  Do you have problems with loss of bowel control? N  Managing your Medications? Y  Managing your Finances? N  Housekeeping or managing your Housekeeping? N  Some recent data might be hidden    Patient Care Team: [name], PA-C as PCP - General (Family Medicine) Thelma Comp, OD as Consulting Physician (Optometry)    Assessment:     Exercise Activities and Dietary recommendations Current Exercise Habits: Home exercise routine, Type of exercise: walking, Time (Minutes): 25, Frequency (Times/Week): 5, Weekly Exercise (Minutes/Week): 125, Intensity: Mild, Exercise limited by: None identified  Goals    . Increase water intake          Recommend increasing water intake to 4-6 glasses a day.       Fall Risk Fall Risk   04/06/2017 03/20/2016  Falls in the past year? No No   Depression Screen PHQ 2/9 Scores 04/06/2017 04/06/2017 03/20/2016  PHQ - 2 Score 0 0 0  PHQ- 9 Score 1 - -     Cognitive Function     6CIT Screen 04/06/2017  What Year? 0 points  What month? 0 points  What time? 0 points  Count back from 20 0 points  Months in reverse 0 points  Repeat phrase 2 points  Total Score 2    Immunization History  Administered Date(s) Administered  . Influenza, High Dose Seasonal PF 07/21/2015, 07/28/2016  . Pneumococcal Conjugate-13 08/26/2014  . Pneumococcal Polysaccharide-23 08/11/1999  . Td 05/04/1997  . Tdap 08/26/2014  . Zoster 02/12/2012   Screening Tests Health Maintenance  Topic Date Due  . PNA vac Low Risk Adult (2 of 2 - PPSV23) 04/08/2017 (Originally 08/27/2015)  . INFLUENZA VACCINE  05/09/2017  . TETANUS/TDAP  08/26/2024  . DEXA SCAN  Completed      Plan:  I have personally reviewed and addressed the Medicare Annual Wellness questionnaire and have noted the following in the patient's chart:  A. Medical and social history B. Use of alcohol, tobacco or illicit drugs  C. Current medications and supplements D. Functional ability and status E.  Nutritional status F.  Physical activity G. Advance directives H. List of other physicians I.  Hospitalizations, surgeries, and ER visits in previous 12 months J.  [employer] such as hearing and vision if needed, cognitive and depression L. Referrals and appointments - none  In addition, I have reviewed and discussed with patient certain preventive protocols, quality metrics, and best practice recommendations. A written personalized care plan for preventive services as well as general preventive health recommendations were provided to patient.  See attached scanned questionnaire for additional information.   Signed,  Fabio Neighbors, LPN Nurse Health Advisor   MD Recommendations: Pt declined Pneumovax 23 today. Pt would  like to discuss this with PCP before receiving due to history.

## 2017-04-09 ENCOUNTER — Ambulatory Visit (INDEPENDENT_AMBULATORY_CARE_PROVIDER_SITE_OTHER): Payer: Medicare Other | Admitting: Physician Assistant

## 2017-04-09 VITALS — BP 120/70 | HR 80 | Temp 98.7°F | Resp 16 | Ht 63.0 in | Wt 148.4 lb

## 2017-04-09 DIAGNOSIS — M810 Age-related osteoporosis without current pathological fracture: Secondary | ICD-10-CM

## 2017-04-09 DIAGNOSIS — I059 Rheumatic mitral valve disease, unspecified: Secondary | ICD-10-CM | POA: Diagnosis not present

## 2017-04-09 DIAGNOSIS — K5730 Diverticulosis of large intestine without perforation or abscess without bleeding: Secondary | ICD-10-CM | POA: Diagnosis not present

## 2017-04-09 DIAGNOSIS — E7800 Pure hypercholesterolemia, unspecified: Secondary | ICD-10-CM | POA: Diagnosis not present

## 2017-04-09 DIAGNOSIS — E038 Other specified hypothyroidism: Secondary | ICD-10-CM | POA: Diagnosis not present

## 2017-04-09 DIAGNOSIS — Z9882 Breast implant status: Secondary | ICD-10-CM | POA: Diagnosis not present

## 2017-04-09 NOTE — Progress Notes (Signed)
Patient: Carla Sanchez Female    DOB: October 15, 1935   81 y.o.   MRN: [id number] Visit Date: 04/09/2017   Today's Provider: [name], PA-C   Chief Complaint  Patient presents with  . Follow-up   Subjective:    HPI Patient here today to follow up on annual wellness visit done on 04/06/17 with nurse health adviser. Patient reports feeling well.   08/23/14 AWE 10/14/14 BMD-osteopenia, recheckin 2 yrs 03/29/15 Colonoscopy-diverticulosis, tortuous colon, Dr. Gustavo Lah, repeat in 10 years Patient had bilateral mastectomy many years ago. She does have saline breast reconstruction. She does not get mammograms any longer.     Allergies  Allergen Reactions  . Celebrex [Celecoxib]      Current Outpatient Prescriptions:  .  ascorbic acid (VITAMIN C) 500 MG tablet, Take 500 mg by mouth daily., Disp: , Rfl:  .  aspirin 81 MG tablet, Take 81 mg by mouth daily., Disp: , Rfl:  .  Biotin 10 MG CAPS, Take by mouth., Disp: , Rfl:  .  Calcium Carb-Cholecalciferol (CALCIUM CARBONATE-VITAMIN D3) 600-400 MG-UNIT TABS, Take 1 tablet by mouth daily., Disp: , Rfl:  .  Multiple Vitamin (MULTIVITAMIN) tablet, Take 1 tablet by mouth daily., Disp: , Rfl:  .  Omega 3 1000 MG CAPS, Take 1 capsule by mouth daily. , Disp: , Rfl:  .  pyridoxine (B-6) 100 MG tablet, Take 100 mg by mouth daily., Disp: , Rfl:  .  valACYclovir (VALTREX) 1000 MG tablet, , Disp: , Rfl:  .  vitamin B-12 (CYANOCOBALAMIN) 1000 MCG tablet, Take 1,000 mcg by mouth daily., Disp: , Rfl:   Review of Systems  Constitutional: Negative.   HENT: Negative.   Eyes: Negative.   Respiratory: Negative.   Cardiovascular: Negative.   Gastrointestinal: Negative.   Endocrine: Negative.   Genitourinary: Negative.   Musculoskeletal: Negative.   Skin: Negative.   Allergic/Immunologic: Negative.   Neurological: Negative.   Hematological: Negative.   Psychiatric/Behavioral: Negative.     Social History  Substance Use Topics    . Smoking status: Never Smoker  . Smokeless tobacco: Never Used  . Alcohol use No   Objective:   BP 120/70 (BP Location: Left Arm, Patient Position: Sitting, Cuff Size: Large)   Pulse 80   Temp 98.7 F (37.1 C) (Oral)   Resp 16   Ht 5\' 3"  (1.6 m)   Wt 148 lb 6.4 oz (67.3 kg)   BMI 26.29 kg/m  Vitals:   04/09/17 1339  BP: 120/70  Pulse: 80  Resp: 16  Temp: 98.7 F (37.1 C)  TempSrc: Oral  Weight: 148 lb 6.4 oz (67.3 kg)  Height: 5\' 3"  (1.6 m)     Physical Exam  Constitutional: She is oriented to person, place, and time. She appears well-developed and well-nourished. No distress.  HENT:  Head: Normocephalic and atraumatic.  Right Ear: Hearing, tympanic membrane, external ear and ear canal normal.  Left Ear: Hearing, tympanic membrane, external ear and ear canal normal.  Nose: Nose normal.  Mouth/Throat: Uvula is midline, oropharynx is clear and moist and mucous membranes are normal. No oropharyngeal exudate.  Eyes: Conjunctivae and EOM are normal. Pupils are equal, round, and reactive to light. Right eye exhibits no discharge. Left eye exhibits no discharge. No scleral icterus.  Neck: Normal range of motion. Neck supple. No JVD present. Carotid bruit is not present. No tracheal deviation present. No thyromegaly present.  Cardiovascular: Normal rate, regular rhythm and intact distal pulses.  Exam reveals no gallop and no friction rub.   Murmur heard. Pulmonary/Chest: Effort normal and breath sounds normal. No respiratory distress. She has no wheezes. She has no rales. She exhibits no tenderness.  Abdominal: Soft. Bowel sounds are normal. She exhibits no distension and no mass. There is no tenderness. There is no rebound and no guarding.  Musculoskeletal: Normal range of motion. She exhibits no edema or tenderness.  Lymphadenopathy:    She has no cervical adenopathy.  Neurological: She is alert and oriented to person, place, and time.  Skin: Skin is warm and dry. No rash  noted. She is not diaphoretic.  Psychiatric: She has a normal mood and affect. Her behavior is normal. Judgment and thought content normal.  Vitals reviewed.      Assessment & Plan:     1. Colon, diverticulosis Last colonoscopy showed diverticulosis in 2016. No issues or bowel changes.  2. 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 No symptoms. Will check labs as below and f/u pending results. - CBC w/Diff/Platelet - Comprehensive Metabolic Panel (CMET) - TSH  3. Age-related osteoporosis without current pathological fracture BMD ordered. Currently on vit d and calcium supplementation. Has completed bisphosphonate therapy.  4. Hypercholesterolemia Strong family history of CAD. Diet controlled. On ASA 81mg . Will check labs as below and f/u pending results. - CBC w/Diff/Platelet - Comprehensive Metabolic Panel (CMET) - Lipid Profile  5. H/O breast augmentation Bilateral breast mastectomy. S/P reconstruction, had silicon initially, now with saline. Does have known leakage bilaterally, L>R. No intervention.  6. Calcium blood increased Will check labs as below and f/u pending results. - Comprehensive Metabolic Panel (CMET)  7. Disorder of mitral valve Stable.   *Of note: Patient is agreeable to get pneumococcal 23 vaccination (last was done 18 yrs ago at age 83). She wants to await and get this when she returns in the fall for her influenza vaccine.       [name], PA-C  [facility]

## 2017-04-09 NOTE — Patient Instructions (Signed)
Health Maintenance for Postmenopausal Women Menopause is a normal process in which your reproductive ability comes to an end. This process happens gradually over a span of months to years, usually between the ages of 22 and 9. Menopause is complete when you have missed 12 consecutive menstrual periods. It is important to talk with your health care provider about some of the most common conditions that affect postmenopausal women, such as heart disease, cancer, and bone loss (osteoporosis). Adopting a healthy lifestyle and getting preventive care can help to promote your health and wellness. Those actions can also lower your chances of developing some of these common conditions. What should I know about menopause? During menopause, you may experience a number of symptoms, such as:  Moderate-to-severe hot flashes.  Night sweats.  Decrease in sex drive.  Mood swings.  Headaches.  Tiredness.  Irritability.  Memory problems.  Insomnia.  Choosing to treat or not to treat menopausal changes is an individual decision that you make with your health care provider. What should I know about hormone replacement therapy and supplements? Hormone therapy products are effective for treating symptoms that are associated with menopause, such as hot flashes and night sweats. Hormone replacement carries certain risks, especially as you become older. If you are thinking about using estrogen or estrogen with progestin treatments, discuss the benefits and risks with your health care provider. What should I know about heart disease and stroke? Heart disease, heart attack, and stroke become more likely as you age. This may be due, in part, to the hormonal changes that your body experiences during menopause. These can affect how your body processes dietary fats, triglycerides, and cholesterol. Heart attack and stroke are both medical emergencies. There are many things that you can do to help prevent heart disease  and stroke:  Have your blood pressure checked at least every 1-2 years. High blood pressure causes heart disease and increases the risk of stroke.  If you are 53-22 years old, ask your health care provider if you should take aspirin to prevent a heart attack or a stroke.  Do not use any tobacco products, including cigarettes, chewing tobacco, or electronic cigarettes. If you need help quitting, ask your health care provider.  It is important to eat a healthy diet and maintain a healthy weight. ? Be sure to include plenty of vegetables, fruits, low-fat dairy products, and lean protein. ? Avoid eating foods that are high in solid fats, added sugars, or salt (sodium).  Get regular exercise. This is one of the most important things that you can do for your health. ? Try to exercise for at least 150 minutes each week. The type of exercise that you do should increase your heart rate and make you sweat. This is known as moderate-intensity exercise. ? Try to do strengthening exercises at least twice each week. Do these in addition to the moderate-intensity exercise.  Know your numbers.Ask your health care provider to check your cholesterol and your blood glucose. Continue to have your blood tested as directed by your health care provider.  What should I know about cancer screening? There are several types of cancer. Take the following steps to reduce your risk and to catch any cancer development as early as possible. Breast Cancer  Practice breast self-awareness. ? This means understanding how your breasts normally appear and feel. ? It also means doing regular breast self-exams. Let your health care provider know about any changes, no matter how small.  If you are 40  or older, have a clinician do a breast exam (clinical breast exam or CBE) every year. Depending on your age, family history, and medical history, it may be recommended that you also have a yearly breast X-ray (mammogram).  If you  have a family history of breast cancer, talk with your health care provider about genetic screening.  If you are at high risk for breast cancer, talk with your health care provider about having an MRI and a mammogram every year.  Breast cancer (BRCA) gene test is recommended for women who have family members with BRCA-related cancers. Results of the assessment will determine the need for genetic counseling and BRCA1 and for BRCA2 testing. BRCA-related cancers include these types: ? Breast. This occurs in males or females. ? Ovarian. ? Tubal. This may also be called fallopian tube cancer. ? Cancer of the abdominal or pelvic lining (peritoneal cancer). ? Prostate. ? Pancreatic.  Cervical, Uterine, and Ovarian Cancer Your health care provider may recommend that you be screened regularly for cancer of the pelvic organs. These include your ovaries, uterus, and vagina. This screening involves a pelvic exam, which includes checking for microscopic changes to the surface of your cervix (Pap test).  For women ages 21-65, health care providers may recommend a pelvic exam and a Pap test every three years. For women ages 79-65, they may recommend the Pap test and pelvic exam, combined with testing for human papilloma virus (HPV), every five years. Some types of HPV increase your risk of cervical cancer. Testing for HPV may also be done on women of any age who have unclear Pap test results.  Other health care providers may not recommend any screening for nonpregnant women who are considered low risk for pelvic cancer and have no symptoms. Ask your health care provider if a screening pelvic exam is right for you.  If you have had past treatment for cervical cancer or a condition that could lead to cancer, you need Pap tests and screening for cancer for at least 20 years after your treatment. If Pap tests have been discontinued for you, your risk factors (such as having a new sexual partner) need to be  reassessed to determine if you should start having screenings again. Some women have medical problems that increase the chance of getting cervical cancer. In these cases, your health care provider may recommend that you have screening and Pap tests more often.  If you have a family history of uterine cancer or ovarian cancer, talk with your health care provider about genetic screening.  If you have vaginal bleeding after reaching menopause, tell your health care provider.  There are currently no reliable tests available to screen for ovarian cancer.  Lung Cancer Lung cancer screening is recommended for adults 69-62 years old who are at high risk for lung cancer because of a history of smoking. A yearly low-dose CT scan of the lungs is recommended if you:  Currently smoke.  Have a history of at least 30 pack-years of smoking and you currently smoke or have quit within the past 15 years. A pack-year is smoking an average of one pack of cigarettes per day for one year.  Yearly screening should:  Continue until it has been 15 years since you quit.  Stop if you develop a health problem that would prevent you from having lung cancer treatment.  Colorectal Cancer  This type of cancer can be detected and can often be prevented.  Routine colorectal cancer screening usually begins at  age 42 and continues through age 45.  If you have risk factors for colon cancer, your health care provider may recommend that you be screened at an earlier age.  If you have a family history of colorectal cancer, talk with your health care provider about genetic screening.  Your health care provider may also recommend using home test kits to check for hidden blood in your stool.  A small camera at the end of a tube can be used to examine your colon directly (sigmoidoscopy or colonoscopy). This is done to check for the earliest forms of colorectal cancer.  Direct examination of the colon should be repeated every  5-10 years until age 71. However, if early forms of precancerous polyps or small growths are found or if you have a family history or genetic risk for colorectal cancer, you may need to be screened more often.  Skin Cancer  Check your skin from head to toe regularly.  Monitor any moles. Be sure to tell your health care provider: ? About any new moles or changes in moles, especially if there is a change in a mole's shape or color. ? If you have a mole that is larger than the size of a pencil eraser.  If any of your family members has a history of skin cancer, especially at a young age, talk with your health care provider about genetic screening.  Always use sunscreen. Apply sunscreen liberally and repeatedly throughout the day.  Whenever you are outside, protect yourself by wearing long sleeves, pants, a wide-brimmed hat, and sunglasses.  What should I know about osteoporosis? Osteoporosis is a condition in which bone destruction happens more quickly than new bone creation. After menopause, you may be at an increased risk for osteoporosis. To help prevent osteoporosis or the bone fractures that can happen because of osteoporosis, the following is recommended:  If you are 46-71 years old, get at least 1,000 mg of calcium and at least 600 mg of vitamin D per day.  If you are older than age 55 but younger than age 65, get at least 1,200 mg of calcium and at least 600 mg of vitamin D per day.  If you are older than age 54, get at least 1,200 mg of calcium and at least 800 mg of vitamin D per day.  Smoking and excessive alcohol intake increase the risk of osteoporosis. Eat foods that are rich in calcium and vitamin D, and do weight-bearing exercises several times each week as directed by your health care provider. What should I know about how menopause affects my mental health? Depression may occur at any age, but it is more common as you become older. Common symptoms of depression  include:  Low or sad mood.  Changes in sleep patterns.  Changes in appetite or eating patterns.  Feeling an overall lack of motivation or enjoyment of activities that you previously enjoyed.  Frequent crying spells.  Talk with your health care provider if you think that you are experiencing depression. What should I know about immunizations? It is important that you get and maintain your immunizations. These include:  Tetanus, diphtheria, and pertussis (Tdap) booster vaccine.  Influenza every year before the flu season begins.  Pneumonia vaccine.  Shingles vaccine.  Your health care provider may also recommend other immunizations. This information is not intended to replace advice given to you by your health care provider. Make sure you discuss any questions you have with your health care provider. Document Released: 11/17/2005  Document Revised: 04/14/2016 Document Reviewed: 06/29/2015 Elsevier Interactive Patient Education  2018 [employer]  

## 2017-04-10 DIAGNOSIS — E7800 Pure hypercholesterolemia, unspecified: Secondary | ICD-10-CM | POA: Diagnosis not present

## 2017-04-10 DIAGNOSIS — E038 Other specified hypothyroidism: Secondary | ICD-10-CM | POA: Diagnosis not present

## 2017-04-11 LAB — CBC WITH DIFFERENTIAL/PLATELET
BASOS: 1 %
Basophils Absolute: 0 10*3/uL (ref 0.0–0.2)
EOS (ABSOLUTE): 0.2 10*3/uL (ref 0.0–0.4)
EOS: 4 %
HEMATOCRIT: 45.2 % (ref 34.0–46.6)
Hemoglobin: 14.5 g/dL (ref 11.1–15.9)
IMMATURE GRANS (ABS): 0 10*3/uL (ref 0.0–0.1)
IMMATURE GRANULOCYTES: 0 %
LYMPHS: 32 %
Lymphocytes Absolute: 2.1 10*3/uL (ref 0.7–3.1)
MCH: 28.8 pg (ref 26.6–33.0)
MCHC: 32.1 g/dL (ref 31.5–35.7)
MCV: 90 fL (ref 79–97)
MONOS ABS: 0.6 10*3/uL (ref 0.1–0.9)
Monocytes: 9 %
NEUTROS PCT: 54 %
Neutrophils Absolute: 3.6 10*3/uL (ref 1.4–7.0)
Platelets: 263 10*3/uL (ref 150–379)
RBC: 5.03 x10E6/uL (ref 3.77–5.28)
RDW: 14.2 % (ref 12.3–15.4)
WBC: 6.6 10*3/uL (ref 3.4–10.8)

## 2017-04-11 LAB — COMPREHENSIVE METABOLIC PANEL
A/G RATIO: 2 (ref 1.2–2.2)
ALT: 17 IU/L (ref 0–32)
AST: 26 IU/L (ref 0–40)
Albumin: 4.3 g/dL (ref 3.5–4.8)
Alkaline Phosphatase: 87 IU/L (ref 39–117)
BUN/Creatinine Ratio: 13 (ref 12–28)
BUN: 11 mg/dL (ref 8–27)
Bilirubin Total: 0.4 mg/dL (ref 0.0–1.2)
CALCIUM: 10.1 mg/dL (ref 8.7–10.3)
CHLORIDE: 103 mmol/L (ref 96–106)
CO2: 27 mmol/L (ref 20–29)
CREATININE: 0.83 mg/dL (ref 0.57–1.00)
GFR, EST AFRICAN AMERICAN: 78 mL/min/{1.73_m2} (ref >59)
GFR, EST NON AFRICAN AMERICAN: 68 mL/min/{1.73_m2} (ref >59)
GLOBULIN, TOTAL: 2.1 g/dL (ref 1.5–4.5)
Glucose: 98 mg/dL (ref 65–99)
Potassium: 5.9 mmol/L — ABNORMAL HIGH (ref 3.5–5.2)
Sodium: 142 mmol/L (ref 134–144)
TOTAL PROTEIN: 6.4 g/dL (ref 6.0–8.5)

## 2017-04-11 LAB — LIPID PANEL
CHOLESTEROL TOTAL: 227 mg/dL — AB (ref 100–199)
Chol/HDL Ratio: 3.2 ratio (ref 0.0–4.4)
HDL: 71 mg/dL (ref >39)
LDL Calculated: 143 mg/dL — ABNORMAL HIGH (ref 0–99)
Triglycerides: 63 mg/dL (ref 0–149)
VLDL Cholesterol Cal: 13 mg/dL (ref 5–40)

## 2017-04-11 LAB — TSH: TSH: 6.89 u[IU]/mL — AB (ref 0.450–4.500)

## 2017-04-12 ENCOUNTER — Telehealth: Payer: Self-pay

## 2017-04-12 NOTE — Telephone Encounter (Signed)
-----   Message from [name], [place] sent at 04/12/2017  1:04 PM EDT ----- Thyroid lab is back up some. If you are having worsening fatigue, dry skin, dry, brittle hair or nails, constipation or weight gain may be beneficial to start thyroid supplementation. If not we will just continue to monitor. Cholesterol is also up slightly from last year, but HDL also increased to 71 from 68 and offers cardioprotection. Continue omega 3 and work on lifestyle modifications with healthy dieting and increasing physical activity.

## 2017-04-12 NOTE — Telephone Encounter (Signed)
Patient advised as directed below.  Thanks,  -Mariyah Upshaw 

## 2017-05-02 DIAGNOSIS — T498X5A Adverse effect of other topical agents, initial encounter: Secondary | ICD-10-CM | POA: Diagnosis not present

## 2017-06-01 DIAGNOSIS — Z85828 Personal history of other malignant neoplasm of skin: Secondary | ICD-10-CM | POA: Diagnosis not present

## 2017-06-01 DIAGNOSIS — Z85820 Personal history of malignant melanoma of skin: Secondary | ICD-10-CM | POA: Diagnosis not present

## 2017-06-01 DIAGNOSIS — D0439 Carcinoma in situ of skin of other parts of face: Secondary | ICD-10-CM | POA: Diagnosis not present

## 2017-06-01 DIAGNOSIS — L570 Actinic keratosis: Secondary | ICD-10-CM | POA: Diagnosis not present

## 2017-06-05 ENCOUNTER — Other Ambulatory Visit: Payer: Medicare Other

## 2017-06-08 ENCOUNTER — Telehealth: Payer: Self-pay | Admitting: Physician Assistant

## 2017-06-08 DIAGNOSIS — N8110 Cystocele, unspecified: Secondary | ICD-10-CM

## 2017-06-08 NOTE — Telephone Encounter (Signed)
Referral placed.

## 2017-06-08 NOTE — Telephone Encounter (Signed)
Pt advised.

## 2017-06-08 NOTE — Telephone Encounter (Signed)
Pt called saying she things she has had a bladder prolapse this week.  She needs a referral to Urologist.  She use to see Dr. Elenor Quinones.  She has [employer]  Pt's call back is [phone]  Thanks Con Memos

## 2017-06-12 ENCOUNTER — Telehealth: Payer: Self-pay | Admitting: Physician Assistant

## 2017-06-12 DIAGNOSIS — N8110 Cystocele, unspecified: Secondary | ICD-10-CM

## 2017-06-12 NOTE — Telephone Encounter (Signed)
Patient advised as directed below. Per patient she can do either of the two providers you suggest. Best number to contact patient is [phone].  Thanks,  -Joseline

## 2017-06-12 NOTE — Telephone Encounter (Signed)
Yes that is what I thought.  Please notify patient and see if there are any GYN she would prefer to see. I normally use Dr. Enzo Bi or Dr. Marcelline Mates

## 2017-06-12 NOTE — Telephone Encounter (Signed)
Pt chose to see Dr Rogers Blocker for bladder prolapse.When calling his office to schedule I was told this is not a urological problem.He suggest to refer pt to GYN

## 2017-06-13 NOTE — Telephone Encounter (Signed)
Referral placed.

## 2017-06-14 ENCOUNTER — Ambulatory Visit
Admission: RE | Admit: 2017-06-14 | Discharge: 2017-06-14 | Disposition: A | Discharge status: Home / Self Care | Payer: Medicare Other | Source: Ambulatory Visit | Attending: Physician Assistant | Admitting: Physician Assistant

## 2017-06-14 DIAGNOSIS — Z780 Asymptomatic menopausal state: Secondary | ICD-10-CM | POA: Diagnosis not present

## 2017-06-14 DIAGNOSIS — M8589 Other specified disorders of bone density and structure, multiple sites: Secondary | ICD-10-CM | POA: Insufficient documentation

## 2017-06-14 DIAGNOSIS — M810 Age-related osteoporosis without current pathological fracture: Secondary | ICD-10-CM

## 2017-06-21 ENCOUNTER — Encounter: Payer: Medicare Other | Admitting: Obstetrics and Gynecology

## 2017-06-27 ENCOUNTER — Encounter: Payer: Self-pay | Admitting: Obstetrics and Gynecology

## 2017-06-27 ENCOUNTER — Ambulatory Visit (INDEPENDENT_AMBULATORY_CARE_PROVIDER_SITE_OTHER): Payer: Medicare Other | Admitting: Obstetrics and Gynecology

## 2017-06-27 VITALS — BP 124/75 | HR 75 | Ht 63.0 in | Wt 145.2 lb

## 2017-06-27 DIAGNOSIS — N816 Rectocele: Secondary | ICD-10-CM

## 2017-06-27 DIAGNOSIS — K5909 Other constipation: Secondary | ICD-10-CM | POA: Diagnosis not present

## 2017-06-27 DIAGNOSIS — N8111 Cystocele, midline: Secondary | ICD-10-CM | POA: Diagnosis not present

## 2017-06-27 DIAGNOSIS — Z960 Presence of urogenital implants: Secondary | ICD-10-CM

## 2017-06-27 DIAGNOSIS — Z9889 Other specified postprocedural states: Secondary | ICD-10-CM | POA: Diagnosis not present

## 2017-06-27 NOTE — Patient Instructions (Addendum)
1. Increase fiber in diet-Metamucil 2. Take Colace 100 mg twice a day for stool softener 3. Increase water intake-6-8 glasses a day 4. Return in 1 week for pessary fitting  About Rectocele  Overview  A rectocele is a type of hernia which causes different degrees of bulging of the rectal tissues into the vaginal wall.  You may even notice that it presses against the vaginal wall so much that some vaginal tissues droop outside of the opening of your vagina.  Causes of Rectocele  The most common cause is childbirth.  The muscles and ligaments in the pelvis that hold up and support the female organs and vagina become stretched and weakened during labor and delivery.  The more babies you have, the more the support tissues are stretched and weakened.  Not everyone who has a baby will develop a rectocele.  Some women have stronger supporting tissue in the pelvis and may not have as much of a problem as others.  Women who have a Cesarean section usually do not get rectocele's unless they pushed a long time prior to the cesarean delivery.  Other conditions that can cause a rectocele include chronic constipation, a chronic cough, a lot of heavy lifting, and obesity.  Older women may have this problem because the loss of female hormones causes the vaginal tissue to become weaker.  Symptoms  There may not be any symptoms.  If you do have symptoms, they may include:  Pelvic pressure in the rectal area  Protrusion of the lower part of the vagina through the opening of the vagina  Constipation and trapping of the stool, making it difficult to have a bowel movement.  In severe cases, you may have to press on the lower part of your vagina to help push the stool out of you rectum.  This is called splinting to empty.  Diagnosing Rectocele  Your health care provider will ask about your symptoms and perform a pelvic exam.  S/he will ask you to bear down, pushing like you are having a bowel movement so as to  see how far the lower part of the vagina protrudes into the vagina and possible outside of the vagina.  Your provider will also ask you to contract the muscles of your pelvis (like you are stopping the stream in the middle of urinating) to determine the strength of your pelvic muscles.  Your provider may also do a rectal exam.  Treatment Options  If you do not have any symptoms, no treatment may be necessary.  Other treatment options include:  Pelvic floor exercises: Contracting the muscles in your genital area may help strengthen your muscles and support the organs.  Be sure to get proper exercise instruction from you physical therapist.  A pessary (removealbe pelvic support device) sometimes helps rectocele symptoms.  Surgery: Surgical repair may be necessary. In some cases the uterus may need to be taken out ( a hysterectomy) as well.  There are many types of surgery for pelvic support problems.  Look for physicians who specialize in repair procedures.  You can take care of yourself by:  Treating and preventing constipation  Avoiding heavy lifting, and lifting correctly (with your legs, not with you waist or back)  Treating a chronic cough or bronchitis  Not smoking  avoiding too much weight gain  Doing pelvic floor exercises   2007, [employer] Doc.33About Cystocele  Overview  The pelvic organs, including the bladder, are normally supported by pelvic floor muscles and ligaments.  When these muscles and ligaments are stretched, weakened or torn, the wall between the bladder and the vagina sags or herniates causing a prolapse, sometimes called a cystocele.  This condition may cause discomfort and problems with emptying the bladder.  It can be present in various stages.  Some people are not aware of the changes.  Others may notice changes at the vaginal opening or a feeling of the bladder dropping outside the body.  Causes of a Cystocele  A cystocele is usually  caused by muscle straining or stretching during childbirth.  In addition, cystocele is more common after menopause, because the hormone estrogen helps keep the elastic tissues around the pelvic organs strong.  A cystocele is more likely to occur when levels of estrogen decrease.  Other causes include: heavy lifting, chronic coughing, previous pelvic surgery and obesity.  Symptoms  A bladder that has dropped from its normal position may cause: unwanted urine leakage (stress incontinence), frequent urination or urge to urinate, incomplete emptying of the bladder (not feeling bladder relief after emptying), pain or discomfort in the vagina, pelvis, groin, lower back or lower abdomen and frequent urinary tract infections.  Mild cases may not cause any symptoms.  Treatment Options  Pelvic floor (Kegel) exercises:  Strength training the muscles in your genital area  Behavioral changes: Treating and preventing constipation, taking time to empty your bladder properly, learning to lift properly and/or avoid heavy lifting when possible, stopping smoking, avoiding weight gain and treating a chronic cough or bronchitis.  A pessary: A vaginal support device is sometimes used to help pelvic support caused by muscle and ligament changes.  Surgery: Surgical repair may be necessary if symptoms cannot be managed with exercise, behavioral changes and a pessary.  Surgery is usually considered for severe cases.   2007, [employer]

## 2017-06-27 NOTE — Progress Notes (Signed)
GYN ENCOUNTER NOTE  Subjective:       Carla Sanchez is a 81 y.o. G27P4 female is here for gynecologic evaluation of the following issues:  1. Pelvic organ prolapse  Status post TAH-age 48 No onset of pelvic pressure with bulge at the vagina noted on 06/06/2017 Status post vaginal sling for stress urinary incontinence 9 years ago; still effective; no stress urinary incontinence symptoms. No urge incontinence symptoms. Patient reports chronic constipation; bowel movement frequency is daily with the aid of prunes for breakfast. The patient does have to splint for bowel movements.      Gynecologic History No LMP recorded. Patient has had a hysterectomy.  Obstetric History OB History  Gravida Para Term Preterm AB Living  4 4       4   SAB TAB Ectopic Multiple Live Births          4    # Outcome Date GA Lbr Len/2nd Weight Sex Delivery Anes PTL Lv  4 Para      Vag-Spont   LIV  3 Para      Vag-Spont   LIV  2 Para      Vag-Spont   LIV  1 Para      Vag-Spont   LIV      Past Medical History:  Diagnosis Date  . Arthritis   . Cancer ([place])    Malignant Melanoma  . Endometriosis   . Hemorrhoids   . Migraines     Past Surgical History:  Procedure Laterality Date  . ABDOMINAL HYSTERECTOMY     in her 50's  . bladder sugery  2008   sling-  dr Elenor Quinones  . Bladder Tack    . BREAST SURGERY     Bilateral Mastectomy  . COLONOSCOPY WITH PROPOFOL N/A 03/29/2015   Procedure: COLONOSCOPY WITH PROPOFOL;  Surgeon: Lollie Sails, MD;  Location[facility] ENDOSCOPY;  Service: Endoscopy;  Laterality: N/A;  . HAND SURGERY Right 04/2015  . HEMORRHOIDECTOMY WITH HEMORRHOID BANDING    . JOINT REPLACEMENT     RT TKR; LT TKR; RT Total Hip Arthroplasty; LT Hip Arthroplasty  . LUNG SURGERY     at age 57, also removed a rib    Current Outpatient Prescriptions on File Prior to Visit  Medication Sig Dispense Refill  . ascorbic acid (VITAMIN C) 500 MG tablet Take 500 mg by mouth daily.    Marland Kitchen aspirin  81 MG tablet Take 81 mg by mouth daily.    . Biotin 10 MG CAPS Take by mouth.    . Calcium Carb-Cholecalciferol (CALCIUM CARBONATE-VITAMIN D3) 600-400 MG-UNIT TABS Take 1 tablet by mouth daily.    . Multiple Vitamin (MULTIVITAMIN) tablet Take 1 tablet by mouth daily.    . Omega 3 1000 MG CAPS Take 1 capsule by mouth daily.     Marland Kitchen pyridoxine (B-6) 100 MG tablet Take 100 mg by mouth daily.    . vitamin B-12 (CYANOCOBALAMIN) 1000 MCG tablet Take 1,000 mcg by mouth daily.     No current facility-administered medications on file prior to visit.     Allergies  Allergen Reactions  . Celebrex [Celecoxib]     Social History   Social History  . Marital status: Married    Spouse name: N/A  . Number of children: 4  . Years of education: N/A   Occupational History  . Not on file.   Social History Main Topics  . Smoking status: Never Smoker  . Smokeless tobacco: Never Used  .  Alcohol use No  . Drug use: No  . Sexual activity: Yes    Birth control/ protection: Surgical   Other Topics Concern  . Not on file   Social History Narrative  . No narrative on file    Family History  Problem Relation Age of Onset  . Heart failure Mother   . Arthritis Mother   . Hypertension Father   . Heart attack Father   . Seizures Brother   . Seizures Sister   . Kidney disease Sister   . Cancer Sister        lymphoma  . Cancer Sister   . Cancer Brother        prostate  . Heart disease Brother 7       heart attack  . Arthritis Other   . Heart attack Other   . Epilepsy Other   . Thyroid cancer Other   . Myasthenia gravis Other   . Prostate cancer Other     The following portions of the patient's history were reviewed and updated as appropriate: allergies, current medications, past family history, past medical history, past social history, past surgical history and problem list.  Review of Systems Review of Systems -Per history of present illness; no history of vasomotor symptoms; no  history of hormone replacement therapy; no history of abnormal Pap smears.  Objective:   BP 124/75   Pulse 75   Ht 5\' 3"  (1.6 m)   Wt 145 lb 3.2 oz (65.9 kg)   BMI 25.72 kg/m  CONSTITUTIONAL: Well-developed, well-nourished female in no acute distress.  HENT:  Normocephalic, atraumatic.  NECK: Normal range of motion, supple, no masses.  Normal thyroid.  SKIN: Skin is warm and dry. No rash noted. Not diaphoretic. No erythema. No pallor. [place]: Alert and oriented to person, place, and time. PSYCHIATRIC: Normal mood and affect. Normal behavior. Normal judgment and thought content. CARDIOVASCULAR:Not Examined RESPIRATORY: Not Examined BREASTS: Not Examined ABDOMEN: Soft, non distended; Non tender.  No Organomegaly. PELVIC:  External Genitalia: Normal  BUS: Normal  Vagina: Mild to moderate atrophy; good support at the urethrovesical junction; second degree cystocele-third-degree with Valsalva; moderate rectocele  Cervix: Surgically absent  Uterus: Surgically absent  Adnexa: Normal; nonpalpable nontender  RV: 1 cm fleshy nodule just to the left of posterior fourchette; external hemorrhoids; normal sphincter tone; no rectal masses  Bladder: Nontender MUSCULOSKELETAL: Normal range of motion. No tenderness.  No cyanosis, clubbing, or edema.     Assessment:   1. Rectocele, Moderate  2. Chronic constipation  3. Cystocele, second third degree  4. Status post pubovaginal sling 2009, currently effective  5. Vaginal atrophy, moderate     Plan:   1. Return in 1 week for pessary fitting 2. Increased water intake 6-8 Glasses per day 3. Colace 100 mg twice a day for stool softener 4. Add Metamucil as fiber in diet  Brayton Mars, MD  Note: This dictation was prepared with Dragon dictation along with smaller phrase technology. Any transcriptional errors that result from this process are unintentional.

## 2017-07-03 ENCOUNTER — Ambulatory Visit (INDEPENDENT_AMBULATORY_CARE_PROVIDER_SITE_OTHER): Payer: Medicare Other | Admitting: Obstetrics and Gynecology

## 2017-07-03 ENCOUNTER — Encounter: Payer: Self-pay | Admitting: Obstetrics and Gynecology

## 2017-07-03 VITALS — BP 111/66 | HR 76 | Ht 63.0 in | Wt 144.0 lb

## 2017-07-03 DIAGNOSIS — Z9889 Other specified postprocedural states: Secondary | ICD-10-CM

## 2017-07-03 DIAGNOSIS — N816 Rectocele: Secondary | ICD-10-CM

## 2017-07-03 DIAGNOSIS — Z960 Presence of urogenital implants: Secondary | ICD-10-CM

## 2017-07-03 DIAGNOSIS — N8111 Cystocele, midline: Secondary | ICD-10-CM | POA: Diagnosis not present

## 2017-07-03 NOTE — Progress Notes (Signed)
Chief complaint: 1. Pessary fitting 2. Cystocele 3. Rectocele  Patient presents for pessary fitting. She does not have urinary incontinence. She is status post pubovaginal sling. She does have chronic constipation and have to splint with bowel movements.  OBJECTIVE: BP 111/66   Pulse 76   Ht 5\' 3"  (1.6 m)   Wt 144 lb (65.3 kg)   BMI 25.51 kg/m  PELVIC:             External Genitalia: Normal             BUS: Normal             Vagina: Mild to moderate atrophy; good support at the urethrovesical junction; second degree cystocele-third-degree with Valsalva; moderate rectocele             Cervix: Surgically absent             Uterus: Surgically absent             Adnexa: Normal; nonpalpable nontender            RV- Normal external exam             Bladder: Nontender  PROCEDURE: Pessary fitting  #5 ring with support pessary-successful  ASSESSMENT:  1. Rectocele, Moderate  2. Chronic constipation  3. Cystocele, second third degree  4. Status post pubovaginal sling 2009, currently effective  5. Vaginal atrophy, moderate  6. Successful pessary fitting  PLAN: 1. #5 ring with support pessary is ordered 2. Return in 07/30/2017 for new pessary insertion  A total of 15 minutes were spent face-to-face with the patient during this encounter and over half of that time dealt with counseling and coordination of care.  Brayton Mars, MD  Note: This dictation was prepared with Dragon dictation along with smaller phrase technology. Any transcriptional errors that result from this process are unintentional.

## 2017-07-03 NOTE — Patient Instructions (Signed)
1. Ring with support pessary #5 is fitted today 2. Return on 07/30/2017 for new pessary insertion

## 2017-07-11 ENCOUNTER — Encounter: Payer: Self-pay | Admitting: Physician Assistant

## 2017-07-11 ENCOUNTER — Ambulatory Visit (INDEPENDENT_AMBULATORY_CARE_PROVIDER_SITE_OTHER): Payer: Medicare Other | Admitting: Physician Assistant

## 2017-07-11 VITALS — BP 130/84 | HR 68 | Temp 98.2°F | Resp 20 | Ht 63.0 in | Wt 144.8 lb

## 2017-07-11 DIAGNOSIS — E038 Other specified hypothyroidism: Secondary | ICD-10-CM | POA: Diagnosis not present

## 2017-07-11 DIAGNOSIS — E7800 Pure hypercholesterolemia, unspecified: Secondary | ICD-10-CM

## 2017-07-11 DIAGNOSIS — Z23 Encounter for immunization: Secondary | ICD-10-CM

## 2017-07-11 LAB — TSH: TSH: 5.09 m[IU]/L — AB (ref 0.40–4.50)

## 2017-07-11 LAB — T4: T4 TOTAL: 9.1 ug/dL (ref 5.1–11.9)

## 2017-07-11 NOTE — Progress Notes (Signed)
Patient: Carla Sanchez Female    DOB: 05-Oct-1936   81 y.o.   MRN: [id number] Visit Date: 07/11/2017  Today's Provider: [name], PA-C   Chief Complaint  Patient presents with  . Follow-up   Subjective:    HPI Patient here today to follow up on labs. Patient had labs checked in July and was advised of thyroid being elevated. Patient also advised that cholesterol is up from last year. Patient advised to continue omega 3 and work on lifestyle modifications with healthy dieting and increasing physical activity. Patient denies any increased fatigue, dry skin or any hair or nail changes. Patient reports persistent constipation, patient reports she has started taking stool softener.     Allergies  Allergen Reactions  . Celebrex [Celecoxib]      Current Outpatient Prescriptions:  .  ascorbic acid (VITAMIN C) 500 MG tablet, Take 500 mg by mouth daily., Disp: , Rfl:  .  aspirin 81 MG tablet, Take 81 mg by mouth daily., Disp: , Rfl:  .  Biotin 10 MG CAPS, Take by mouth., Disp: , Rfl:  .  Calcium Carb-Cholecalciferol (CALCIUM CARBONATE-VITAMIN D3) 600-400 MG-UNIT TABS, Take 1 tablet by mouth daily., Disp: , Rfl:  .  co-enzyme Q-10 30 MG capsule, Take 30 mg by mouth 3 (three) times daily., Disp: , Rfl:  .  Lysine 500 MG TABS, Take by mouth., Disp: , Rfl:  .  Multiple Vitamin (MULTIVITAMIN) tablet, Take 1 tablet by mouth daily., Disp: , Rfl:  .  Omega 3 1000 MG CAPS, Take 1 capsule by mouth daily. , Disp: , Rfl:  .  pyridoxine (B-6) 100 MG tablet, Take 100 mg by mouth daily., Disp: , Rfl:  .  vitamin B-12 (CYANOCOBALAMIN) 1000 MCG tablet, Take 1,000 mcg by mouth daily., Disp: , Rfl:   Review of Systems  Constitutional: Negative.   Respiratory: Negative.   Cardiovascular: Negative.   Gastrointestinal: Positive for constipation.  Musculoskeletal: Negative.   Neurological: Negative.     Social History  Substance Use Topics  . Smoking status: Never Smoker  .  Smokeless tobacco: Never Used  . Alcohol use No   Objective:   BP 130/84 (BP Location: Left Arm, Patient Position: Sitting, Cuff Size: Normal)   Pulse 68   Temp 98.2 F (36.8 C) (Oral)   Resp 20   Ht 5\' 3"  (1.6 m)   Wt 144 lb 12.8 oz (65.7 kg)   SpO2 96%   BMI 25.65 kg/m  Vitals:   07/11/17 0811  BP: 130/84  Pulse: 68  Resp: 20  Temp: 98.2 F (36.8 C)  TempSrc: Oral  SpO2: 96%  Weight: 144 lb 12.8 oz (65.7 kg)  Height: 5\' 3"  (1.6 m)     Physical Exam  Constitutional: She appears well-developed and well-nourished. No distress.  Neck: Normal range of motion. Neck supple. No JVD present. No tracheal deviation present. No thyromegaly present.  Cardiovascular: Normal rate, regular rhythm and normal heart sounds.  Exam reveals no gallop and no friction rub.   No murmur heard. Pulmonary/Chest: Effort normal and breath sounds normal. No respiratory distress. She has no wheezes. She has no rales.  Lymphadenopathy:    She has no cervical adenopathy.  Skin: She is not diaphoretic.  Vitals reviewed.      Assessment & Plan:     1. 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 Patient is asymptomatic. Sister has  had thyroid carcinoma.  - TSH - T4  2. Hypercholesterolemia Stable. Continue lifestyle modifications and fish oil.   3. Need for influenza vaccination Flu vaccine given today without complication. Patient sat upright for 15 minutes to check for adverse reaction before being released. - Flu vaccine HIGH DOSE PF  4. Need for pneumococcal vaccination Pneumovax given to patient without complications. Patient sat for 15 minutes after administration and was tolerated well without adverse effects. - Pneumococcal polysaccharide vaccine 23-valent greater than or equal to 2yo subcutaneous/IM       [name], PA-C  [facility]

## 2017-07-11 NOTE — Patient Instructions (Signed)
Hypothyroidism Hypothyroidism is a disorder of the thyroid. The thyroid is a large gland that is located in the lower front of the neck. The thyroid releases hormones that control how the body works. With hypothyroidism, the thyroid does not make enough of these hormones. What are the causes? Causes of hypothyroidism may include:  Viral infections.  Pregnancy.  Your own defense system (immune system) attacking your thyroid.  Certain medicines.  Birth defects.  Past radiation treatments to your head or neck.  Past treatment with radioactive iodine.  Past surgical removal of part or all of your thyroid.  Problems with the gland that is located in the center of your brain (pituitary).  What are the signs or symptoms? Signs and symptoms of hypothyroidism may include:  Feeling as though you have no energy (lethargy).  Inability to tolerate cold.  Weight gain that is not explained by a change in diet or exercise habits.  Dry skin.  Coarse hair.  Menstrual irregularity.  Slowing of thought processes.  Constipation.  Sadness or depression.  How is this diagnosed? Your health care provider may diagnose hypothyroidism with blood tests and ultrasound tests. How is this treated? Hypothyroidism is treated with medicine that replaces the hormones that your body does not make. After you begin treatment, it may take several weeks for symptoms to go away. Follow these instructions at home:  Take medicines only as directed by your health care provider.  If you start taking any new medicines, tell your health care provider.  Keep all follow-up visits as directed by your health care provider. This is important. As your condition improves, your dosage needs may change. You will need to have blood tests regularly so that your health care provider can watch your condition. Contact a health care provider if:  Your symptoms do not get better with treatment.  You are taking thyroid  replacement medicine and: ? You sweat excessively. ? You have tremors. ? You feel anxious. ? You lose weight rapidly. ? You cannot tolerate heat. ? You have emotional swings. ? You have diarrhea. ? You feel weak. Get help right away if:  You develop chest pain.  You develop an irregular heartbeat.  You develop a rapid heartbeat. This information is not intended to replace advice given to you by your health care provider. Make sure you discuss any questions you have with your health care provider. Document Released: 09/25/2005 Document Revised: 03/02/2016 Document Reviewed: 02/10/2014 Elsevier Interactive Patient Education  2017 [employer]  

## 2017-07-31 ENCOUNTER — Ambulatory Visit (INDEPENDENT_AMBULATORY_CARE_PROVIDER_SITE_OTHER): Payer: Medicare Other | Admitting: Obstetrics and Gynecology

## 2017-07-31 ENCOUNTER — Encounter: Payer: Self-pay | Admitting: Obstetrics and Gynecology

## 2017-07-31 VITALS — BP 107/71 | HR 76 | Ht 63.0 in | Wt 149.7 lb

## 2017-07-31 DIAGNOSIS — Z9889 Other specified postprocedural states: Secondary | ICD-10-CM

## 2017-07-31 DIAGNOSIS — N8111 Cystocele, midline: Secondary | ICD-10-CM

## 2017-07-31 DIAGNOSIS — N816 Rectocele: Secondary | ICD-10-CM

## 2017-07-31 DIAGNOSIS — Z960 Presence of urogenital implants: Secondary | ICD-10-CM

## 2017-07-31 DIAGNOSIS — Z4689 Encounter for fitting and adjustment of other specified devices: Secondary | ICD-10-CM

## 2017-07-31 NOTE — Progress Notes (Signed)
Chief complaint: 1.  Pessary insertion  Patient presents for new pessary insertion.  She was fitted for a #5 ring with diaphragm support pessary.  Instructions are given regarding management.  Pessary was inserted today.  Patient will return in 10 days for pessary maintenance.  She will attempt to remove the pessary and reinserted the pessary 1 or 2 days prior to her follow-up appointment.  ASSESSMENT: 1.  Cystocele, symptomatic 2.  Rectocele 3.  History of pubovaginal sling; no incontinence  PLAN: 1.  #5 ring with diaphragm support pessary is inserted 2.  Trimosan gel intravaginally weekly 3.  Return in 10 days for pessary maintenance  Brayton Mars, MD  Note: This dictation was prepared with Dragon dictation along with smaller phrase technology. Any transcriptional errors that result from this process are unintentional.

## 2017-07-31 NOTE — Patient Instructions (Signed)
1.  #5 ring with diaphragm support pessary is inserted today 2.  Recommend Trimosan gel intravaginally once a week 3.  Return in 10 days for follow-up pessary maintenance

## 2017-08-09 ENCOUNTER — Ambulatory Visit (INDEPENDENT_AMBULATORY_CARE_PROVIDER_SITE_OTHER): Payer: Medicare Other | Admitting: Obstetrics and Gynecology

## 2017-08-09 ENCOUNTER — Encounter: Payer: Self-pay | Admitting: Obstetrics and Gynecology

## 2017-08-09 VITALS — BP 112/71 | HR 64 | Ht 63.0 in | Wt 147.8 lb

## 2017-08-09 DIAGNOSIS — N8111 Cystocele, midline: Secondary | ICD-10-CM | POA: Diagnosis not present

## 2017-08-09 DIAGNOSIS — Z9889 Other specified postprocedural states: Secondary | ICD-10-CM | POA: Diagnosis not present

## 2017-08-09 DIAGNOSIS — Z4689 Encounter for fitting and adjustment of other specified devices: Secondary | ICD-10-CM

## 2017-08-09 DIAGNOSIS — N816 Rectocele: Secondary | ICD-10-CM

## 2017-08-09 DIAGNOSIS — Z960 Presence of urogenital implants: Secondary | ICD-10-CM

## 2017-08-09 NOTE — Patient Instructions (Addendum)
1.  Return in 4 weeks for pessary maintenance 2.  Continue to attempt to remove and insert pessary.  If you are unable to reinsert the pessary, please contact the office for same-day follow-up to help with insertion. 3.  Continue using Trimosan gel intravaginal weekly.

## 2017-08-09 NOTE — Progress Notes (Signed)
Chief complaint: 1.  Pessary maintenance 2.  Cystocele 3.  Rectocele  Carla Sanchez presents today for pessary maintenance.  #5 ring with support diaphragm pessary was inserted 10 days ago.  She has been struggling to remove the pessary herself without success.  Husband also attempted to remove the pessary but was unsuccessful.  Techniques for removal were explained. Patient does not report any significant vaginal discharge.  Does feel that the pessary is helping her as it alleviates pelvic pressure and fullness.  She does not have any nocturia at night.  Past medical history, past surgical history, problem list, medications, and allergies are reviewed  OBJECTIVE: BP 112/71   Pulse 64   Ht 5\' 3"  (1.6 m)   Wt 147 lb 12.8 oz (67 kg)   BMI 26.18 kg/m  Pleasant female no acute distress.  She is alert and oriented. PELVIC: External Genitalia: Normal BUS: Normal Vagina: Mild to moderate atrophy; good support at the urethrovesical junction; second degree cystocele-third-degree with Valsalva; moderate rectocele; no discharge; no ulceration or abrasion Cervix: Surgically absent Uterus: Surgically absent Adnexa: Normal; nonpalpable nontender  RV- Normal external exam Bladder: Nontender  PROCEDURE: Pessary is removed, cleaned, and reinserted (#5 ring with diaphragm support pessary)  ASSESSMENT: 1.  Rectocele, moderate 2.  Chronic constipation 3.  Cystocele, second to third-degree 4.  Status post pubovaginal sling 2009, functional 5.  Vaginal atrophy, moderate, stable 6.  Normal pessary maintenance  PLAN: 1.  Pessary is removed, cleaned, and reinserted 2.  Techniques for pessary removal were discussed 3.  Return in 4 weeks for pessary maintenance or sooner if she needs assistance in pessary insertion 4.  Continue using Trimosan gel intravaginal weekly  A total of 15 minutes were spent face-to-face  with the patient during this encounter and over half of that time dealt with counseling and coordination of care.  Brayton Mars, MD  Note: This dictation was prepared with Dragon dictation along with smaller phrase technology. Any transcriptional errors that result from this process are unintentional.

## 2017-09-06 ENCOUNTER — Encounter: Payer: Medicare Other | Admitting: Obstetrics and Gynecology

## 2017-09-11 ENCOUNTER — Encounter: Payer: Self-pay | Admitting: Obstetrics and Gynecology

## 2017-09-11 ENCOUNTER — Ambulatory Visit (INDEPENDENT_AMBULATORY_CARE_PROVIDER_SITE_OTHER): Payer: Medicare Other | Admitting: Obstetrics and Gynecology

## 2017-09-11 VITALS — BP 137/80 | HR 66 | Ht 63.0 in | Wt 149.7 lb

## 2017-09-11 DIAGNOSIS — N816 Rectocele: Secondary | ICD-10-CM

## 2017-09-11 DIAGNOSIS — B3731 Acute candidiasis of vulva and vagina: Secondary | ICD-10-CM | POA: Insufficient documentation

## 2017-09-11 DIAGNOSIS — Z4689 Encounter for fitting and adjustment of other specified devices: Secondary | ICD-10-CM

## 2017-09-11 DIAGNOSIS — N8111 Cystocele, midline: Secondary | ICD-10-CM | POA: Diagnosis not present

## 2017-09-11 DIAGNOSIS — B373 Candidiasis of vulva and vagina: Secondary | ICD-10-CM | POA: Insufficient documentation

## 2017-09-11 MED ORDER — FLUCONAZOLE 150 MG PO TABS: 150.0000 mg | ORAL_TABLET | ORAL | 0 refills | Status: DC

## 2017-09-11 NOTE — Progress Notes (Signed)
Chief complaint: 1.  Pessary maintenance 2.  Cystocele 3.  Rectocele  Pessary maintenance-4 weeks (#5 ring with diaphragm support pessary). Trimosan gel-weekly Patient reports no significant vaginal bleeding; recent vaginal discharge; no pelvic pain. Pelvic pressure and fullness sensation is alleviated with the pessary.  Incontinence is also alleviated with the pessary use. No nocturia while pessary is in place.  Both the patient and her husband have been successful in removing the pessary and reinserting it at this time.  She is very pleased with this self management.  Past medical history: Past surgical history, problem list, medications, and allergies are reviewed  OBJECTIVE: BP 137/80   Pulse 66   Ht 5\' 3"  (1.6 m)   Wt 149 lb 11.2 oz (67.9 kg)   BMI 26.52 kg/m  Pleasant well-appearing female in no acute distress.  Alert and oriented. Abdomen: Soft, nontender PELVIC: External Genitalia: Normal BUS: Normal Vagina: Mild to moderate atrophy; good support at the urethrovesical junction; second degree cystocele-third-degree with Valsalva; moderate rectocele; thick white discharge consistent with monilia; no ulceration or abrasion Cervix: Surgically absent Uterus: Surgically absent Adnexa: Normal; nonpalpable nontender RV-Normal external exam Bladder: Nontender   PROCEDURE: #5 ring with diaphragm support pessary is removed, cleaned, and reinserted; new swab is obtained to rule out monilia  ASSESSMENT: 1.  Rectocele, moderate 2.  Chronic constipation 3.  Cystocele, second to third-degree 4.  Status post pubovaginal sling 2009, functional 5.  Vaginal atrophy, moderate, stable 6.  Normal pessary maintenance 7.  Monilia vaginitis  PLAN: 1.  Pessary is removed, cleaned, and reinserted. 2.  Return in 3 months for pessary maintenance or sooner if difficulties with pessary removal and  insertion are encountered 3.  Continue using Trimosan gel intravaginally weekly 4.  Diflucan 150 mg orally every 3 days x2 doses 5.  Nuswab is obtained to rule out monilia vaginitis  A total of 15 minutes were spent face-to-face with the patient during this encounter and over half of that time dealt with counseling and coordination of care.  Brayton Mars, MD  Note: This dictation was prepared with Dragon dictation along with smaller phrase technology. Any transcriptional errors that result from this process are unintentional.

## 2017-09-11 NOTE — Patient Instructions (Signed)
1.  Return in 3 months for pessary maintenance 2.  Diflucan 150 mg orally every 3 days x2 doses 3.  Continue using Trimosan gel intravaginally once a week or as needed with pessary reinsertion.

## 2017-09-11 NOTE — Addendum Note (Signed)
Addended by: Elouise Munroe on: 09/11/2017 10:05 AM   Modules accepted: Orders

## 2017-09-19 IMAGING — CR DG CHEST 2V
2 series · 2 of 2 positions shown · non-contrast
Comparison: 01/14/2013

CLINICAL DATA: Chest pain, tightness and shortness of breath for
months getting worse, history thymoma

EXAM:
CHEST  2 VIEW

[chest pa]
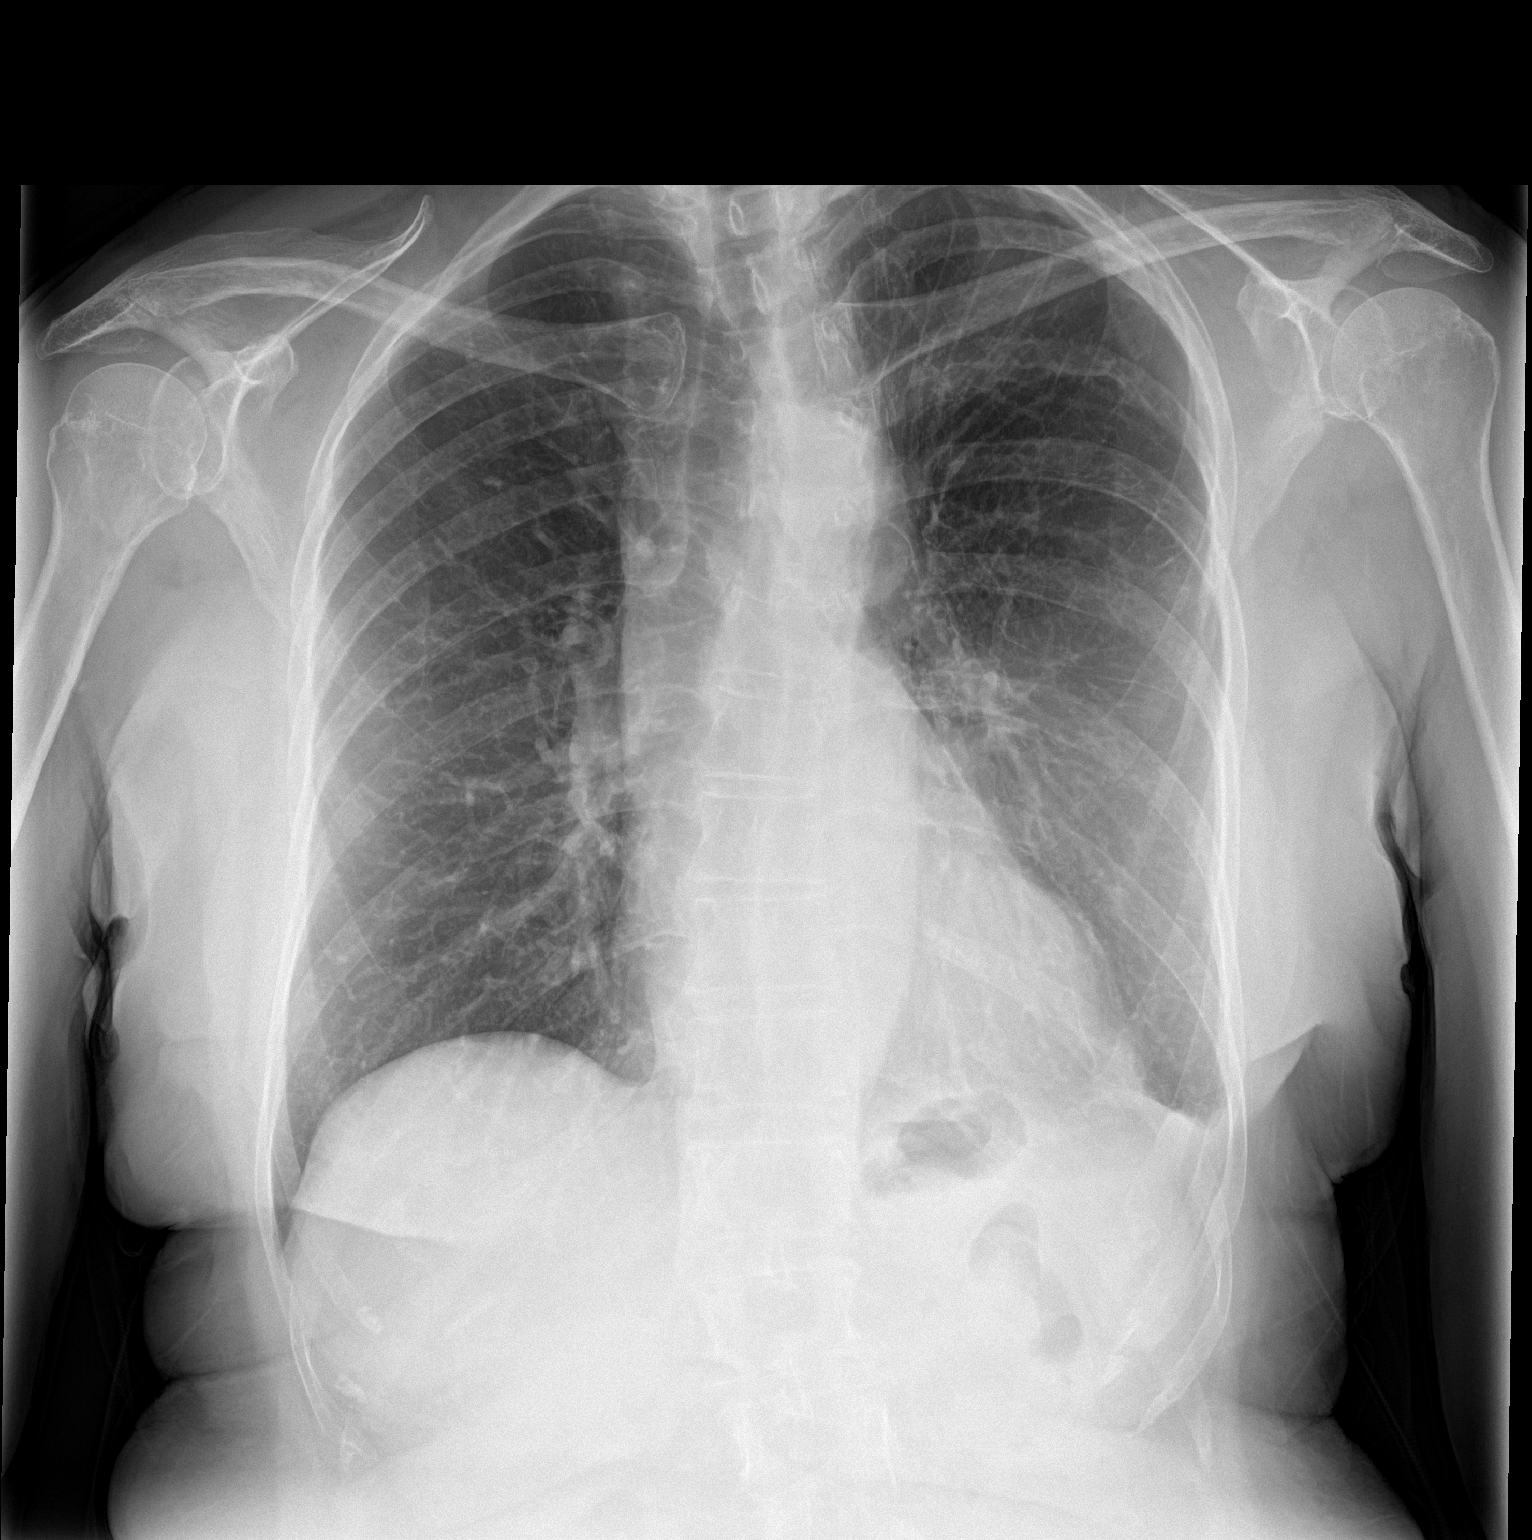

[chest lat]
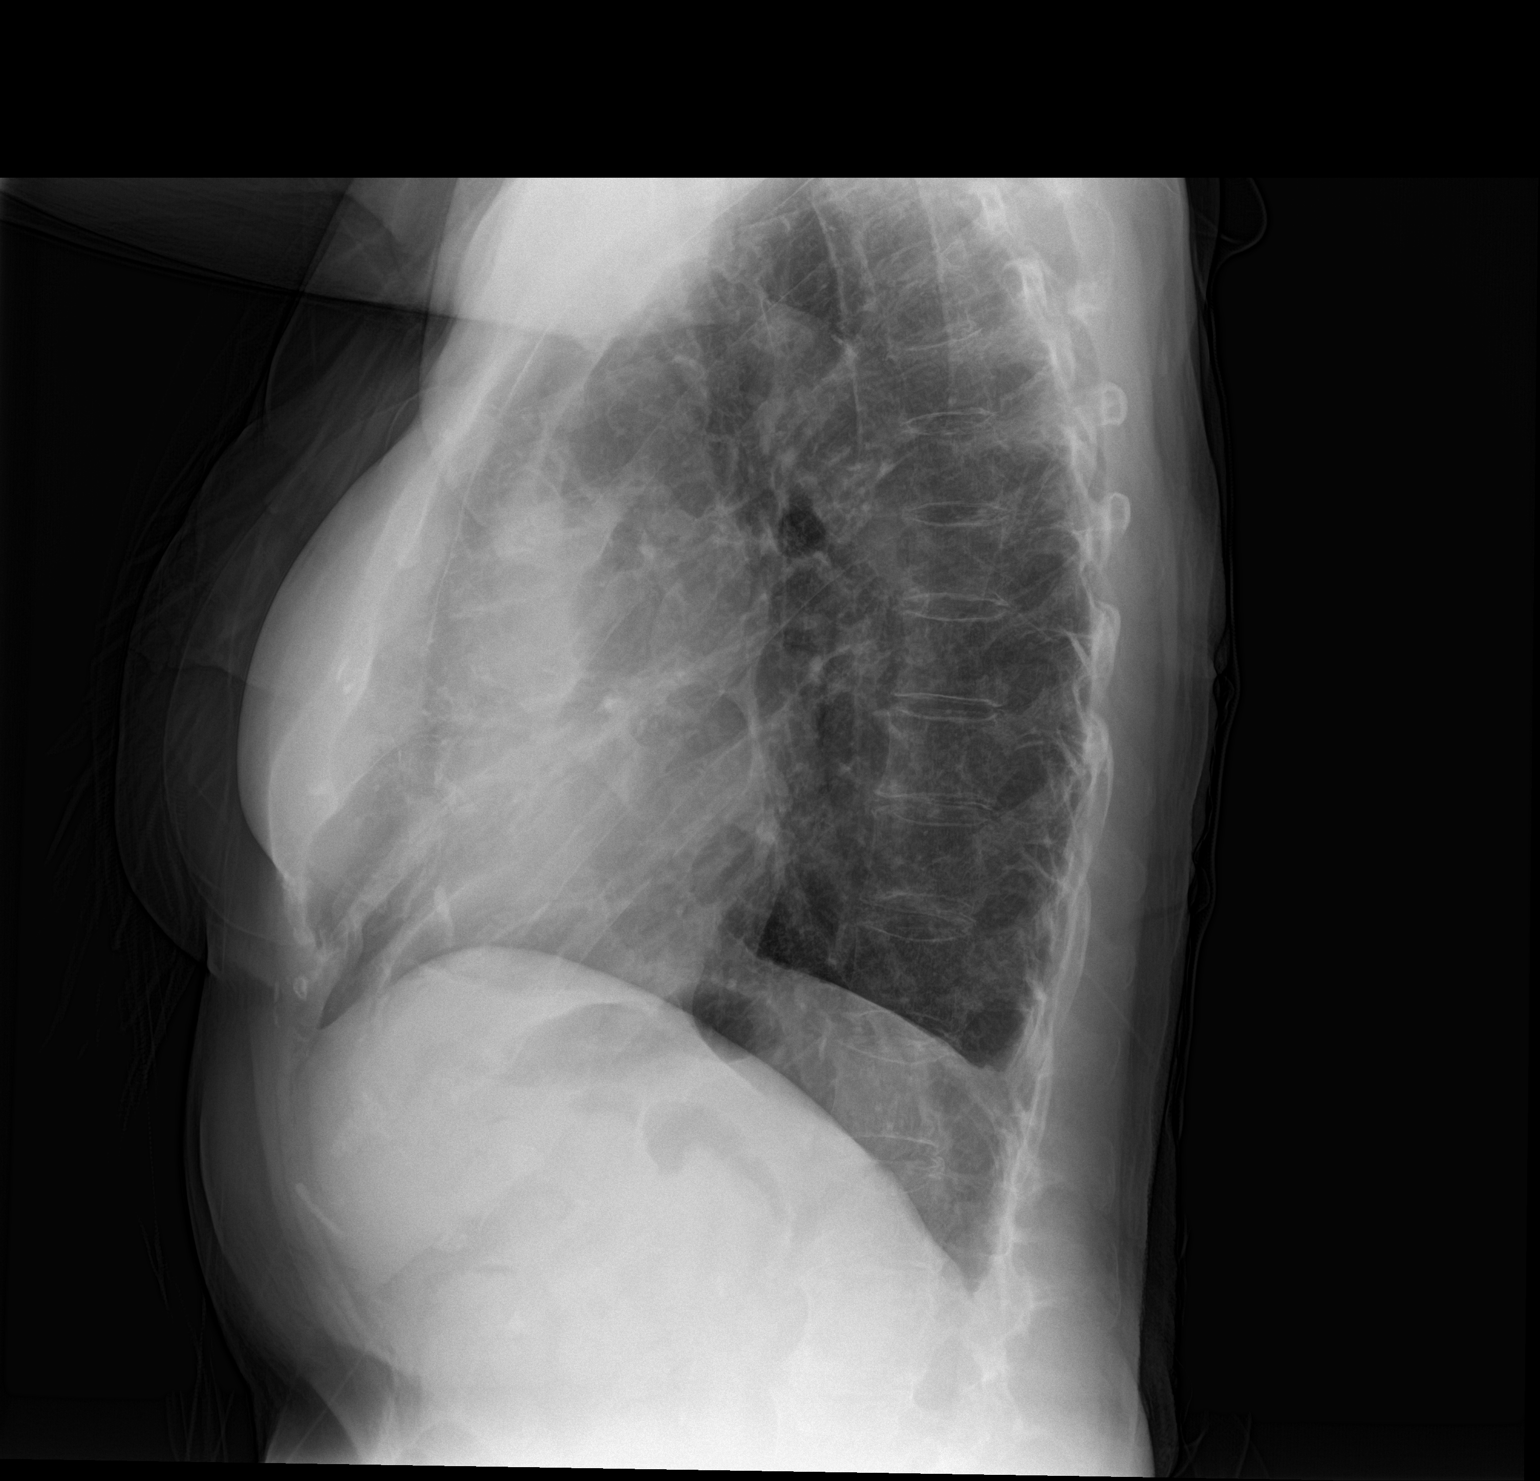

[2 of 2 positions shown; findings below may reference images not displayed]

FINDINGS: Upper normal heart size.

Stable mediastinal contours and pulmonary vascularity.

Hyperinflated lungs consistent with COPD.

LEFT upper lobe scarring.

Pectus deformity.

No acute infiltrate, pleural effusion or pneumothorax.

BILATERAL breast prostheses.

Bones significantly demineralized with thoracolumbar scoliosis.
IMPRESSION: COPD changes with LEFT upper lobe scarring.

No acute abnormalities.

## 2017-09-28 LAB — CANDIDA 6 SPECIES PROFILE, NAA
CANDIDA ALBICANS, NAA: POSITIVE — AB
CANDIDA LUSITANIAE, NAA: NEGATIVE
CANDIDA PARAPSILOSIS, NAA: POSITIVE — AB
Candida glabrata, NAA: NEGATIVE
Candida krusei, NAA: NEGATIVE
Candida tropicalis, NAA: NEGATIVE

## 2017-10-24 ENCOUNTER — Encounter: Payer: Self-pay | Admitting: Physician Assistant

## 2017-10-24 ENCOUNTER — Ambulatory Visit (INDEPENDENT_AMBULATORY_CARE_PROVIDER_SITE_OTHER): Payer: Medicare Other | Admitting: Physician Assistant

## 2017-10-24 VITALS — BP 122/80 | HR 71 | Temp 97.9°F | Wt 149.8 lb

## 2017-10-24 DIAGNOSIS — M79602 Pain in left arm: Secondary | ICD-10-CM | POA: Diagnosis not present

## 2017-10-24 DIAGNOSIS — R079 Chest pain, unspecified: Secondary | ICD-10-CM | POA: Diagnosis not present

## 2017-10-24 NOTE — Progress Notes (Signed)
Patient: Carla Sanchez Female    DOB: December 02, 1935   82 y.o.   MRN: [id number] Visit Date: 10/24/2017  Today's Provider: [name], PA-C   Chief Complaint  Patient presents with  . Arm Pain   Subjective:    Arm Pain   Incident onset: 2-3 weeks ago. There was no injury mechanism. Pain location: left arm and shoulder pain under breast radiating into the upper back. The quality of the pain is described as aching. Pain course: intermittent but improved since Sunday. She has tried nothing for the symptoms.   She reports multiple deaths in her family and husband's family. She states she has a lot of stress at this time, but has been coping better since the weekend as well.      Allergies  Allergen Reactions  . Celebrex [Celecoxib]      Current Outpatient Medications:  .  ascorbic acid (VITAMIN C) 500 MG tablet, Take 500 mg by mouth daily., Disp: , Rfl:  .  aspirin 81 MG tablet, Take 81 mg by mouth daily., Disp: , Rfl:  .  Biotin 10 MG CAPS, Take by mouth., Disp: , Rfl:  .  Calcium Carb-Cholecalciferol (CALCIUM CARBONATE-VITAMIN D3) 600-400 MG-UNIT TABS, Take 1 tablet by mouth daily., Disp: , Rfl:  .  co-enzyme Q-10 30 MG capsule, Take 30 mg by mouth 3 (three) times daily., Disp: , Rfl:  .  Lysine 500 MG TABS, Take by mouth., Disp: , Rfl:  .  Multiple Vitamin (MULTIVITAMIN) tablet, Take 1 tablet by mouth daily., Disp: , Rfl:  .  Omega 3 1000 MG CAPS, Take 1 capsule by mouth daily. , Disp: , Rfl:  .  pyridoxine (B-6) 100 MG tablet, Take 100 mg by mouth daily., Disp: , Rfl:  .  vitamin B-12 (CYANOCOBALAMIN) 1000 MCG tablet, Take 1,000 mcg by mouth daily., Disp: , Rfl:   Review of Systems  Constitutional: Negative.   Respiratory: Negative.   Cardiovascular: Negative.   Gastrointestinal: Negative.   Musculoskeletal: Negative.   Neurological: Negative.     Social History   Tobacco Use  . Smoking status: Never Smoker  . Smokeless tobacco: Never Used  Substance  Use Topics  . Alcohol use: No   Objective:   BP 122/80 (BP Location: Left Arm, Patient Position: Sitting, Cuff Size: Normal)   Pulse 71   Temp 97.9 F (36.6 C) (Oral)   Wt 149 lb 12.8 oz (67.9 kg)   SpO2 97%   BMI 26.54 kg/m    Physical Exam  Constitutional: She appears well-developed and well-nourished. No distress.  Neck: Normal range of motion. Neck supple. No JVD present. No tracheal deviation present. No thyromegaly present.  Cardiovascular: Normal rate, regular rhythm and normal heart sounds. Exam reveals no gallop and no friction rub.  No murmur heard. Pulmonary/Chest: Effort normal and breath sounds normal. No respiratory distress. She has no wheezes. She has no rales.  Musculoskeletal: She exhibits no edema.       Right shoulder: Normal.       Left shoulder: Normal.  Lymphadenopathy:    She has no cervical adenopathy.  Skin: She is not diaphoretic.  Vitals reviewed.      Assessment & Plan:     1. Left arm pain EKG checked today due to pain being over the left chest area and radiating into left arm occasionally. EKG showed NSR rate of 71 with no ST changes noted. Patient denies having pain with exertion. Patient has had  a stress test approx 5-6 years ago and reports it was normal. Suspect it was anxiety related due to recent deaths and stressors. Advised patient to call if symptoms return and I will refer her to cardiology to make sure not cardiac related. She is in agreement. - EKG 12-Lead  2. Chest pain, unspecified type See above medical treatment plan.       [name], PA-C  [facility]

## 2017-12-11 ENCOUNTER — Encounter: Payer: Medicare Other | Admitting: Obstetrics and Gynecology

## 2017-12-12 ENCOUNTER — Encounter: Payer: Self-pay | Admitting: Obstetrics and Gynecology

## 2017-12-12 ENCOUNTER — Ambulatory Visit (INDEPENDENT_AMBULATORY_CARE_PROVIDER_SITE_OTHER): Payer: Medicare Other | Admitting: Obstetrics and Gynecology

## 2017-12-12 VITALS — BP 126/70 | HR 69 | Ht 63.0 in | Wt 150.2 lb

## 2017-12-12 DIAGNOSIS — N8111 Cystocele, midline: Secondary | ICD-10-CM | POA: Diagnosis not present

## 2017-12-12 DIAGNOSIS — N816 Rectocele: Secondary | ICD-10-CM | POA: Diagnosis not present

## 2017-12-12 DIAGNOSIS — Z4689 Encounter for fitting and adjustment of other specified devices: Secondary | ICD-10-CM

## 2017-12-12 NOTE — Patient Instructions (Signed)
1.  Return in 6 months for pessary maintenance 2.  Continue with Trimosan gel intravaginal weekly 3.  Continue with self-monitoring of insertion and removal of pessary.  If problems arise, feel free to contact our office.

## 2017-12-12 NOTE — Progress Notes (Signed)
Chief complaint: 1.  Pessary maintenance 2.  Midline cystocele 3.  Rectocele 4.  History of chronic constipation, resolved  Pessary maintenance-12 weeks (#5 ring with diaphragm support pessary) Trimosan gel weekly intravaginal, no problems The patient denies bleeding, vaginal discharge, and pelvic pain. Bowel function is normal without constipation. Bladder function is normal.  No significant incontinence. No nocturia while pessary is in place Patient is caring for the pessary herself and takes a break up to 3 days at a time during use.  Both she and her husband have been successful in inserting and removing pessary. Patient is very pleased with pessary to date.  Past medical history, past surgical history, problem list, medications, and allergies are reviewed  OBJECTIVE: BP 126/70   Pulse 69   Ht 5\' 3"  (1.6 m)   Wt 150 lb 3.2 oz (68.1 kg)   BMI 26.61 kg/m  Pleasant well-appearing female in no acute distress.  Alert and oriented. Abdomen: Soft, nontender Pelvic exam: External genitalia-normal BUS-normal Vagina-mild atrophy; good support at the urethrovesical junction; second-degree to third-degree cystocele with Valsalva; moderate rectocele; no significant discharge; no ulceration or abrasion Cervix-surgically absent Uterus-surgically absent Adnexa-not palpated Rectovaginal-normal external exam Bladder-nontender  PROCEDURE: #5 ring with diaphragm support pessary is removed, cleaned, and reinserted.  ASSESSMENT: 1. Rectocele, moderate 2. Chronic constipation 3. Cystocele, second to third-degree 4. Status post pubovaginal sling 2009, functional 5. Vaginal atrophy, moderate, stable 6. Normal pessary maintenance  PLAN: 1.  Pessary is removed, cleaned, and reinserted. 2.  Return in 6 months for pessary maintenance or sooner if difficulties with pessary removal and insertion are encountered 3.  Continue using Trimosan gel intravaginally weekly 4.  Return in 6 months  for pessary maintenance  A total of 15 minutes were spent face-to-face with the patient during this encounter and over half of that time dealt with counseling and coordination of care.  Brayton Mars, MD  Note: This dictation was prepared with Dragon dictation along with smaller phrase technology. Any transcriptional errors that result from this process are unintentional.

## 2018-03-07 DIAGNOSIS — D2271 Melanocytic nevi of right lower limb, including hip: Secondary | ICD-10-CM | POA: Diagnosis not present

## 2018-03-07 DIAGNOSIS — Z08 Encounter for follow-up examination after completed treatment for malignant neoplasm: Secondary | ICD-10-CM | POA: Diagnosis not present

## 2018-03-07 DIAGNOSIS — Z85820 Personal history of malignant melanoma of skin: Secondary | ICD-10-CM | POA: Diagnosis not present

## 2018-03-07 DIAGNOSIS — C44519 Basal cell carcinoma of skin of other part of trunk: Secondary | ICD-10-CM | POA: Diagnosis not present

## 2018-03-07 DIAGNOSIS — R208 Other disturbances of skin sensation: Secondary | ICD-10-CM | POA: Diagnosis not present

## 2018-03-07 DIAGNOSIS — L821 Other seborrheic keratosis: Secondary | ICD-10-CM | POA: Diagnosis not present

## 2018-03-07 DIAGNOSIS — D2262 Melanocytic nevi of left upper limb, including shoulder: Secondary | ICD-10-CM | POA: Diagnosis not present

## 2018-03-07 DIAGNOSIS — C44529 Squamous cell carcinoma of skin of other part of trunk: Secondary | ICD-10-CM | POA: Diagnosis not present

## 2018-03-07 DIAGNOSIS — D225 Melanocytic nevi of trunk: Secondary | ICD-10-CM | POA: Diagnosis not present

## 2018-03-07 DIAGNOSIS — D2272 Melanocytic nevi of left lower limb, including hip: Secondary | ICD-10-CM | POA: Diagnosis not present

## 2018-03-07 DIAGNOSIS — D2261 Melanocytic nevi of right upper limb, including shoulder: Secondary | ICD-10-CM | POA: Diagnosis not present

## 2018-03-07 DIAGNOSIS — Z85828 Personal history of other malignant neoplasm of skin: Secondary | ICD-10-CM | POA: Diagnosis not present

## 2018-03-07 DIAGNOSIS — D485 Neoplasm of uncertain behavior of skin: Secondary | ICD-10-CM | POA: Diagnosis not present

## 2018-04-04 DIAGNOSIS — C4492 Squamous cell carcinoma of skin, unspecified: Secondary | ICD-10-CM

## 2018-04-04 DIAGNOSIS — C44529 Squamous cell carcinoma of skin of other part of trunk: Secondary | ICD-10-CM | POA: Diagnosis not present

## 2018-04-04 DIAGNOSIS — C4442 Squamous cell carcinoma of skin of scalp and neck: Secondary | ICD-10-CM | POA: Diagnosis not present

## 2018-04-04 HISTORY — DX: Squamous cell carcinoma of skin, unspecified: C44.92

## 2018-04-08 ENCOUNTER — Ambulatory Visit (INDEPENDENT_AMBULATORY_CARE_PROVIDER_SITE_OTHER): Payer: Medicare Other

## 2018-04-08 VITALS — BP 108/62 | HR 69 | Temp 98.3°F | Ht 63.0 in | Wt 146.8 lb

## 2018-04-08 DIAGNOSIS — Z0000 Encounter for general adult medical examination without abnormal findings: Secondary | ICD-10-CM

## 2018-04-08 NOTE — Progress Notes (Signed)
Subjective:   Carla Sanchez is a 82 y.o. female who presents for Medicare Annual (Subsequent) preventive examination.  Review of Systems:  N/A Cardiac Risk Factors include: advanced age (>78men, >29 women)     Objective:     Vitals: BP 108/62 (BP Location: Right Arm)   Pulse 69   Temp 98.3 F (36.8 C) (Oral)   Ht 5\' 3"  (1.6 m)   Wt 146 lb 12.8 oz (66.6 kg)   BMI 26.00 kg/m   Body mass index is 26 kg/m.  Advanced Directives 04/08/2018 04/06/2017 03/20/2016 10/13/2015  Does Patient Have a Medical Advance Directive? Yes Yes Yes Yes  Type of Paramedic of [place];Living will Living will;Healthcare Power of [place];Living will Living will;Healthcare Power of [place] in Chart? No - copy requested No - copy requested - -    Tobacco Social History   Tobacco Use  Smoking Status Never Smoker  Smokeless Tobacco Never Used     Counseling given: Not Answered   Clinical Intake:  Pre-visit preparation completed: Yes  Pain : No/denies pain Pain Score: 0-No pain     Nutritional Status: BMI 25 -29 Overweight Nutritional Risks: None Diabetes: No  How often do you need to have someone help you when you read instructions, pamphlets, or other written materials from your doctor or pharmacy?: 1 - Never  Interpreter Needed?: No  Information entered by :: [facility], LPN  Past Medical History:  Diagnosis Date  . Arthritis   . BCC (basal cell carcinoma) 04/18/2018  . Cancer ([place])    Malignant Melanoma  . Endometriosis   . Hemorrhoids   . Migraines   . SCCA (squamous cell carcinoma) of skin 04/04/2018   removed from left shoulder   Past Surgical History:  Procedure Laterality Date  . ABDOMINAL HYSTERECTOMY     in her 50's  . bladder sugery  2008   sling-  dr Elenor Quinones  . Bladder Tack    . BREAST SURGERY     Bilateral Mastectomy  . COLONOSCOPY WITH PROPOFOL N/A 03/29/2015   Procedure: COLONOSCOPY WITH PROPOFOL;  Surgeon: Lollie Sails, MD;  Location: [facility] ENDOSCOPY;  Service: Endoscopy;  Laterality: N/A;  . HAND SURGERY Right 04/2015  . HEMORRHOIDECTOMY WITH HEMORRHOID BANDING    . JOINT REPLACEMENT     RT TKR; LT TKR; RT Total Hip Arthroplasty; LT Hip Arthroplasty  . LUNG SURGERY     at age 65, also removed a rib   Family History  Problem Relation Age of Onset  . Heart failure Mother   . Arthritis Mother   . Hypertension Father   . Heart attack Father   . Seizures Brother   . Seizures Sister   . Kidney disease Sister   . Cancer Sister        lymphoma  . Cancer Sister   . Breast cancer Sister   . Cancer Brother        prostate  . Heart disease Brother 82       heart attack  . Arthritis Other   . Heart attack Other   . Epilepsy Other   . Thyroid cancer Other   . Myasthenia gravis Other   . Prostate cancer Other    Social History   Socioeconomic History  . Marital status: Married    Spouse name: Not on file  . Number of children: 4  . Years of education: Not on file  .  Highest education level: 12th grade  Occupational History  . Not on file  Social Needs  . Financial resource strain: Not hard at all  . Food insecurity:    Worry: Never true    Inability: Never true  . Transportation needs:    Medical: No    Non-medical: No  Tobacco Use  . Smoking status: Never Smoker  . Smokeless tobacco: Never Used  Substance and Sexual Activity  . Alcohol use: Yes    Comment: 1-2 drinks occasionally  . Drug use: No  . Sexual activity: Yes    Birth control/protection: Surgical  Lifestyle  . Physical activity:    Days per week: Not on file    Minutes per session: Not on file  . Stress: Not at all  Relationships  . Social connections:    Talks on phone: Not on file    Gets together: Not on file    Attends religious service: Not on file    Active member of club or organization: Not on file    Attends meetings of clubs or organizations:  Not on file    Relationship status: Not on file  Other Topics Concern  . Not on file  Social History Narrative  . Not on file    Outpatient Encounter Medications as of 04/08/2018  Medication Sig  . ascorbic acid (VITAMIN C) 500 MG tablet Take 500 mg by mouth daily.  Marland Kitchen aspirin 81 MG tablet Take 81 mg by mouth daily.  . Biotin 10 MG CAPS Take by mouth daily.   . Calcium Carb-Cholecalciferol (CALCIUM CARBONATE-VITAMIN D3) 600-400 MG-UNIT TABS Take 1 tablet by mouth daily.  Marland Kitchen co-enzyme Q-10 30 MG capsule Take 30 mg by mouth daily.   Marland Kitchen Lysine 500 MG TABS Take by mouth daily.   . Multiple Vitamin (MULTIVITAMIN) tablet Take 1 tablet by mouth daily.  . Omega 3 1000 MG CAPS Take 1 capsule by mouth daily.   Levin Erp SULFATE VAGINAL (TRIMO-SAN) 0.025 % GEL Place 1 applicator vaginally once a week.   . pyridoxine (B-6) 100 MG tablet Take 100 mg by mouth daily.  . vitamin B-12 (CYANOCOBALAMIN) 1000 MCG tablet Take 1,000 mcg by mouth daily.   No facility-administered encounter medications on file as of 04/08/2018.     Activities of Daily Living In your present state of health, do you have any difficulty performing the following activities: 04/08/2018  Hearing? N  Vision? Y  Comment Per pt- eye get tired easily. Pt plans to set up an eye exam this year.   Difficulty concentrating or making decisions? N  Walking or climbing stairs? N  Dressing or bathing? N  Doing errands, shopping? N  Preparing Food and eating ? N  Using the Toilet? N  In the past six months, have you accidently leaked urine? N  Comment Has a pessary.  Do you have problems with loss of bowel control? N  Managing your Medications? N  Managing your Finances? N  Housekeeping or managing your Housekeeping? N  Some recent data might be hidden    Patient Care Team: [name], PA-C as PCP - General (Family Medicine) Thelma Comp, [place] as Consulting Physician (Optometry) Oneta Rack, MD as Consulting  Physician (Dermatology)    Assessment:   This is a routine wellness examination for [place].  Exercise Activities and Dietary recommendations Current Exercise Habits: Home exercise routine, Type of exercise: walking, Time (Minutes): 15, Frequency (Times/Week): 7, Weekly Exercise (Minutes/Week): 105, Intensity: Mild, Exercise limited  by: None identified  Goals    . DIET - INCREASE WATER INTAKE     Recommend increasing water intake to 4-6 glasses a day.        Fall Risk Fall Risk  04/08/2018 07/11/2017 04/06/2017 03/20/2016  Falls in the past year? Yes No No No  Number falls in past yr: 1 - - -  Injury with Fall? No - - -  Follow up Falls prevention discussed - - -   Is the patient's home free of loose throw rugs in walkways, pet beds, electrical cords, etc?   yes      Grab bars in the bathroom? yes      Handrails on the stairs?   yes      Adequate lighting?   yes  Timed Get Up and Go performed: N/A  Depression Screen PHQ 2/9 Scores 04/08/2018 04/06/2017 04/06/2017 03/20/2016  PHQ - 2 Score 0 0 0 0  PHQ- 9 Score - 1 - -     Cognitive Function: Pt declined screening today.      6CIT Screen 04/06/2017  What Year? 0 points  What month? 0 points  What time? 0 points  Count back from 20 0 points  Months in reverse 0 points  Repeat phrase 2 points  Total Score 2    Immunization History  Administered Date(s) Administered  . Influenza, High Dose Seasonal PF 07/21/2015, 07/28/2016, 07/11/2017  . Pneumococcal Conjugate-13 08/26/2014  . Pneumococcal Polysaccharide-23 08/11/1999, 07/11/2017  . Td 05/04/1997  . Tdap 08/26/2014  . Zoster 02/12/2012    Qualifies for Shingles Vaccine? Due for Shingles vaccine. Declined my offer to administer today. Education has been provided regarding the importance of this vaccine. Pt has been advised to call her insurance company to determine her out of pocket expense. Advised she may also receive this vaccine at her local pharmacy or Health Dept.  Verbalized acceptance and understanding.  Screening Tests Health Maintenance  Topic Date Due  . INFLUENZA VACCINE  05/09/2018  . TETANUS/TDAP  08/26/2024  . DEXA SCAN  Completed  . PNA vac Low Risk Adult  Completed    Cancer Screenings: Lung: Low Dose CT Chest recommended if Age 57-80 years, 30 pack-year currently smoking OR have quit w/in 15years. Patient does not qualify. Breast:  Up to date on Mammogram? Yes   Up to date of Bone Density/Dexa? Yes Colorectal: Up to date  Additional Screenings:  Hepatitis C Screening: N/A     Plan:  I have personally reviewed and addressed the Medicare Annual Wellness questionnaire and have noted the following in the patient's chart:  A. Medical and social history B. Use of alcohol, tobacco or illicit drugs  C. Current medications and supplements D. Functional ability and status E.  Nutritional status F.  Physical activity G. Advance directives H. List of other physicians I.  Hospitalizations, surgeries, and ER visits in previous 12 months J.  [place] such as hearing and vision if needed, cognitive and depression L. Referrals and appointments - none  In addition, I have reviewed and discussed with patient certain preventive protocols, quality metrics, and best practice recommendations. A written personalized care plan for preventive services as well as general preventive health recommendations were provided to patient.  See attached scanned questionnaire for additional information.   Signed,  Fabio Neighbors, LPN Nurse Health Advisor   Nurse Recommendations: None.

## 2018-04-08 NOTE — Patient Instructions (Addendum)
Carla Sanchez , Thank you for taking time to come for your Medicare Wellness Visit. I appreciate your ongoing commitment to your health goals. Please review the following plan we discussed and let me know if I can assist you in the future.   Screening recommendations/referrals: Colonoscopy: Up to date Mammogram: Up to date Bone Density: Up to date Recommended yearly ophthalmology/optometry visit for glaucoma screening and checkup Recommended yearly dental visit for hygiene and checkup  Vaccinations: Influenza vaccine: Up to date Pneumococcal vaccine: Up to date Tdap vaccine: Up to date Shingles vaccine: Pt declines today.     Advanced directives: Please bring a copy of your POA (Power of Attorney) and/or Living Will to your next appointment.   Conditions/risks identified: Recommend increasing water intake to 4-6 glasses a day.   Next appointment: 04/25/18 @ 10 AM   Preventive Care 65 Years and Older, Female Preventive care refers to lifestyle choices and visits with your health care provider that can promote health and wellness. What does preventive care include?  A yearly physical exam. This is also called an annual well check.  Dental exams once or twice a year.  Routine eye exams. Ask your health care provider how often you should have your eyes checked.  Personal lifestyle choices, including:  Daily care of your teeth and gums.  Regular physical activity.  Eating a healthy diet.  Avoiding tobacco and drug use.  Limiting alcohol use.  Practicing safe sex.  Taking low-dose aspirin every day.  Taking vitamin and mineral supplements as recommended by your health care provider. What happens during an annual well check? The services and screenings done by your health care provider during your annual well check will depend on your age, overall health, lifestyle risk factors, and family history of disease. Counseling  Your health care provider may ask you questions  about your:  Alcohol use.  Tobacco use.  Drug use.  Emotional well-being.  Home and relationship well-being.  Sexual activity.  Eating habits.  History of falls.  Memory and ability to understand (cognition).  Work and work Statistician.  Reproductive health. Screening  You may have the following tests or measurements:  Height, weight, and BMI.  Blood pressure.  Lipid and cholesterol levels. These may be checked every 5 years, or more frequently if you are over 37 years old.  Skin check.  Lung cancer screening. You may have this screening every year starting at age 6 if you have a 30-pack-year history of smoking and currently smoke or have quit within the past 15 years.  Fecal occult blood test (FOBT) of the stool. You may have this test every year starting at age 28.  Flexible sigmoidoscopy or colonoscopy. You may have a sigmoidoscopy every 5 years or a colonoscopy every 10 years starting at age 21.  Hepatitis C blood test.  Hepatitis B blood test.  Sexually transmitted disease (STD) testing.  Diabetes screening. This is done by checking your blood sugar (glucose) after you have not eaten for a while (fasting). You may have this done every 1-3 years.  Bone density scan. This is done to screen for osteoporosis. You may have this done starting at age 32.  Mammogram. This may be done every 1-2 years. Talk to your health care provider about how often you should have regular mammograms. Talk with your health care provider about your test results, treatment options, and if necessary, the need for more tests. Vaccines  Your health care provider may recommend certain vaccines, such  as:  Influenza vaccine. This is recommended every year.  Tetanus, diphtheria, and acellular pertussis (Tdap, Td) vaccine. You may need a Td booster every 10 years.  Zoster vaccine. You may need this after age 11.  Pneumococcal 13-valent conjugate (PCV13) vaccine. One dose is  recommended after age 36.  Pneumococcal polysaccharide (PPSV23) vaccine. One dose is recommended after age 64. Talk to your health care provider about which screenings and vaccines you need and how often you need them. This information is not intended to replace advice given to you by your health care provider. Make sure you discuss any questions you have with your health care provider. Document Released: 10/22/2015 Document Revised: 06/14/2016 Document Reviewed: 07/27/2015 Elsevier Interactive Patient Education  2017 [place] Prevention in the Home Falls can cause injuries. They can happen to people of all ages. There are many things you can do to make your home safe and to help prevent falls. What can I do on the outside of my home?  Regularly fix the edges of walkways and driveways and fix any cracks.  Remove anything that might make you trip as you walk through a door, such as a raised step or threshold.  Trim any bushes or trees on the path to your home.  Use bright outdoor lighting.  Clear any walking paths of anything that might make someone trip, such as rocks or tools.  Regularly check to see if handrails are loose or broken. Make sure that both sides of any steps have handrails.  Any raised decks and porches should have guardrails on the edges.  Have any leaves, snow, or ice cleared regularly.  Use sand or salt on walking paths during winter.  Clean up any spills in your garage right away. This includes oil or grease spills. What can I do in the bathroom?  Use night lights.  Install grab bars by the toilet and in the tub and shower. Do not use towel bars as grab bars.  Use non-skid mats or decals in the tub or shower.  If you need to sit down in the shower, use a plastic, non-slip stool.  Keep the floor dry. Clean up any water that spills on the floor as soon as it happens.  Remove soap buildup in the tub or shower regularly.  Attach bath mats  securely with double-sided non-slip rug tape.  Do not have throw rugs and other things on the floor that can make you trip. What can I do in the bedroom?  Use night lights.  Make sure that you have a light by your bed that is easy to reach.  Do not use any sheets or blankets that are too big for your bed. They should not hang down onto the floor.  Have a firm chair that has side arms. You can use this for support while you get dressed.  Do not have throw rugs and other things on the floor that can make you trip. What can I do in the kitchen?  Clean up any spills right away.  Avoid walking on wet floors.  Keep items that you use a lot in easy-to-reach places.  If you need to reach something above you, use a strong step stool that has a grab bar.  Keep electrical cords out of the way.  Do not use floor polish or wax that makes floors slippery. If you must use wax, use non-skid floor wax.  Do not have throw rugs and other things on the  floor that can make you trip. What can I do with my stairs?  Do not leave any items on the stairs.  Make sure that there are handrails on both sides of the stairs and use them. Fix handrails that are broken or loose. Make sure that handrails are as long as the stairways.  Check any carpeting to make sure that it is firmly attached to the stairs. Fix any carpet that is loose or worn.  Avoid having throw rugs at the top or bottom of the stairs. If you do have throw rugs, attach them to the floor with carpet tape.  Make sure that you have a light switch at the top of the stairs and the bottom of the stairs. If you do not have them, ask someone to add them for you. What else can I do to help prevent falls?  Wear shoes that:  Do not have high heels.  Have rubber bottoms.  Are comfortable and fit you well.  Are closed at the toe. Do not wear sandals.  If you use a stepladder:  Make sure that it is fully opened. Do not climb a closed  stepladder.  Make sure that both sides of the stepladder are locked into place.  Ask someone to hold it for you, if possible.  Clearly mark and make sure that you can see:  Any grab bars or handrails.  First and last steps.  Where the edge of each step is.  Use tools that help you move around (mobility aids) if they are needed. These include:  Canes.  Walkers.  Scooters.  Crutches.  Turn on the lights when you go into a dark area. Replace any light bulbs as soon as they burn out.  Set up your furniture so you have a clear path. Avoid moving your furniture around.  If any of your floors are uneven, fix them.  If there are any pets around you, be aware of where they are.  Review your medicines with your doctor. Some medicines can make you feel dizzy. This can increase your chance of falling. Ask your doctor what other things that you can do to help prevent falls. This information is not intended to replace advice given to you by your health care provider. Make sure you discuss any questions you have with your health care provider. Document Released: 07/22/2009 Document Revised: 03/02/2016 Document Reviewed: 10/30/2014 Elsevier Interactive Patient Education  2017 [employer].

## 2018-04-15 ENCOUNTER — Ambulatory Visit (INDEPENDENT_AMBULATORY_CARE_PROVIDER_SITE_OTHER): Payer: Medicare Other | Admitting: Physician Assistant

## 2018-04-15 ENCOUNTER — Encounter: Payer: Self-pay | Admitting: Physician Assistant

## 2018-04-15 VITALS — BP 112/80 | HR 64 | Temp 98.2°F | Resp 16 | Ht 63.0 in | Wt 145.8 lb

## 2018-04-15 DIAGNOSIS — C4371 Malignant melanoma of right lower limb, including hip: Secondary | ICD-10-CM

## 2018-04-15 DIAGNOSIS — E7800 Pure hypercholesterolemia, unspecified: Secondary | ICD-10-CM

## 2018-04-15 DIAGNOSIS — J449 Chronic obstructive pulmonary disease, unspecified: Secondary | ICD-10-CM

## 2018-04-15 DIAGNOSIS — E038 Other specified hypothyroidism: Secondary | ICD-10-CM | POA: Diagnosis not present

## 2018-04-15 NOTE — Progress Notes (Signed)
Patient: Carla Sanchez, Female    DOB: 11/25/1935, 82 y.o.   MRN: [id number] Visit Date: 04/15/2018  Today's Provider: [name], PA-C   Chief Complaint  Patient presents with  . Annual Exam   Subjective:     Complete Physical Carla Sanchez is a 82 y.o. female. She feels fairly well. She reports exercising daily. She reports she is sleeping well. AWE on 04/08/18 with NHA.  04/06/17 CPE 05/26/14 Mammogram 06/14/17 BMD-Osteopenia 03/28/05 Colonoscopy-Diverticulosis -----------------------------------------------------------  Review of Systems  Constitutional: Negative.   HENT: Positive for voice change.   Eyes: Positive for itching.  Respiratory: Positive for cough and shortness of breath.   Cardiovascular: Negative.   Gastrointestinal: Positive for constipation.  Endocrine: Negative.   Genitourinary: Positive for vaginal discharge.  Musculoskeletal: Positive for back pain.  Skin: Negative.   Allergic/Immunologic: Negative.   Neurological: Negative.   Hematological: Bruises/bleeds easily.  Psychiatric/Behavioral: Negative.   All are chronic issues, none new.  Social History   Socioeconomic History  . Marital status: Married    Spouse name: Not on file  . Number of children: 4  . Years of education: Not on file  . Highest education level: 12th grade  Occupational History  . Not on file  Social Needs  . Financial resource strain: Not hard at all  . Food insecurity:    Worry: Never true    Inability: Never true  . Transportation needs:    Medical: No    Non-medical: No  Tobacco Use  . Smoking status: Never Smoker  . Smokeless tobacco: Never Used  Substance and Sexual Activity  . Alcohol use: Yes    Comment: 1-2 drinks occasionally  . Drug use: No  . Sexual activity: Yes    Birth control/protection: Surgical  Lifestyle  . Physical activity:    Days per week: Not on file    Minutes per session: Not on file  . Stress: Not  at all  Relationships  . Social connections:    Talks on phone: Not on file    Gets together: Not on file    Attends religious service: Not on file    Active member of club or organization: Not on file    Attends meetings of clubs or organizations: Not on file    Relationship status: Not on file  . Intimate partner violence:    Fear of current or ex partner: Not on file    Emotionally abused: Not on file    Physically abused: Not on file    Forced sexual activity: Not on file  Other Topics Concern  . Not on file  Social History Narrative  . Not on file    Past Medical History:  Diagnosis Date  . Arthritis   . BCC (basal cell carcinoma) 04/18/2018  . Cancer ([place])    Malignant Melanoma  . Endometriosis   . Hemorrhoids   . Migraines   . SCCA (squamous cell carcinoma) of skin 04/04/2018   removed from left shoulder     Patient Active Problem List   Diagnosis Date Noted  . Monilial vaginitis 09/11/2017  . Pessary maintenance 09/11/2017  . Cystocele, midline 06/27/2017  . Rectocele 06/27/2017  . Chest pain 10/15/2015  . Allergic rhinitis 10/13/2015  . Abnormal CAT scan 10/13/2015  . Calcification of intervertebral cartilage or disc of cervical region 10/13/2015  . Chronic constipation 10/13/2015  . H/O breast augmentation 10/13/2015  . Calcium blood increased 10/13/2015  .  Hypercholesterolemia 10/13/2015  . Lung mass 10/13/2015  . 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 Shortness of breath 10/13/2015  . Abnormal echocardiogram 02/23/2015  . Hypothyroidism due to fibrous invasive thyroiditis 02/23/2015  . CMC arthritis, thumb, degenerative 10/13/2014  . OP (osteoporosis) 09/30/2009  . Benign neoplasm of colon 07/08/2003  . Benign neoplasm of large bowel 07/08/2003  . Colon, diverticulosis 07/08/2003  . Adaptive colitis 07/08/2003  . Malignant melanoma of skin of lower limb, including hip ([place]) 09/21/1994  . Disorder of mitral valve 08/02/1993    Past Surgical History:  Procedure  Laterality Date  . ABDOMINAL HYSTERECTOMY     in her 50's  . bladder sugery  2008   sling-  dr Elenor Quinones  . Bladder Tack    . BREAST SURGERY     Bilateral Mastectomy  . COLONOSCOPY WITH PROPOFOL N/A 03/29/2015   Procedure: COLONOSCOPY WITH PROPOFOL;  Surgeon: Lollie Sails, MD;  Location: [facility] ENDOSCOPY;  Service: Endoscopy;  Laterality: N/A;  . HAND SURGERY Right 04/2015  . HEMORRHOIDECTOMY WITH HEMORRHOID BANDING    . JOINT REPLACEMENT     RT TKR; LT TKR; RT Total Hip Arthroplasty; LT Hip Arthroplasty  . LUNG SURGERY     at age 71, also removed a rib    Her family history includes Arthritis in her mother and other; Breast cancer in her sister; Cancer in her brother, sister, and sister; Epilepsy in her other; Heart attack in her father and other; Heart disease (age of onset: 77) in her brother; Heart failure in her mother; Hypertension in her father; Kidney disease in her sister; Myasthenia gravis in her other; Prostate cancer in her other; Seizures in her brother and sister; Thyroid cancer in her other.      Current Outpatient Medications:  .  ascorbic acid (VITAMIN C) 500 MG tablet, Take 500 mg by mouth daily., Disp: , Rfl:  .  aspirin 81 MG tablet, Take 81 mg by mouth daily., Disp: , Rfl:  .  Biotin 10 MG CAPS, Take by mouth daily. , Disp: , Rfl:  .  Calcium Carb-Cholecalciferol (CALCIUM CARBONATE-VITAMIN D3) 600-400 MG-UNIT TABS, Take 1 tablet by mouth daily., Disp: , Rfl:  .  co-enzyme Q-10 30 MG capsule, Take 30 mg by mouth daily. , Disp: , Rfl:  .  Lysine 500 MG TABS, Take by mouth daily. , Disp: , Rfl:  .  Multiple Vitamin (MULTIVITAMIN) tablet, Take 1 tablet by mouth daily., Disp: , Rfl:  .  Omega 3 1000 MG CAPS, Take 1 capsule by mouth daily. , Disp: , Rfl:  .  OXYQUINOLONE SULFATE VAGINAL (TRIMO-SAN) 0.025 % GEL, Place 1 applicator vaginally once a week. , Disp: , Rfl:  .  pyridoxine (B-6) 100 MG tablet, Take 100 mg by mouth daily., Disp: , Rfl:  .  vitamin B-12  (CYANOCOBALAMIN) 1000 MCG tablet, Take 1,000 mcg by mouth daily., Disp: , Rfl:   Patient Care Team: [name], PA-C as PCP - General (Family Medicine) Thelma Comp, OD as Consulting Physician (Optometry) Oneta Rack, MD as Consulting Physician (Dermatology)     Objective:   Vitals: BP 112/80 (BP Location: Left Arm, Patient Position: Sitting, Cuff Size: Normal)   Pulse 64   Temp 98.2 F (36.8 C)   Resp 16   Ht 5\' 3"  (1.6 m)   Wt 145 lb 12.8 oz (66.1 kg)   SpO2 96%   BMI 25.83 kg/m   Physical Exam  Constitutional: She is oriented to person, place, and  time. She appears well-developed and well-nourished. No distress.  HENT:  Head: Normocephalic and atraumatic.  Right Ear: Hearing, tympanic membrane, external ear and ear canal normal.  Left Ear: Hearing, tympanic membrane, external ear and ear canal normal.  Nose: Nose normal.  Mouth/Throat: Uvula is midline, oropharynx is clear and moist and mucous membranes are normal. No oropharyngeal exudate.  Eyes: Pupils are equal, round, and reactive to light. Conjunctivae and EOM are normal. Right eye exhibits no discharge. Left eye exhibits no discharge. No scleral icterus.  Neck: Normal range of motion. Neck supple. No JVD present. No tracheal deviation present. No thyromegaly present.  Cardiovascular: Normal rate, regular rhythm, normal heart sounds and intact distal pulses. Exam reveals no gallop and no friction rub.  No murmur heard. Pulmonary/Chest: Effort normal and breath sounds normal. No respiratory distress. She has no wheezes. She has no rales. She exhibits no tenderness.  Abdominal: Soft. Bowel sounds are normal. She exhibits no distension and no mass. There is no tenderness. There is no rebound and no guarding.  Musculoskeletal: Normal range of motion. She exhibits no edema or tenderness.  Lymphadenopathy:    She has no cervical adenopathy.  Neurological: She is alert and oriented to person, place, and  time.  Skin: Skin is warm and dry. No rash noted. She is not diaphoretic.  Psychiatric: She has a normal mood and affect. Her behavior is normal. Judgment and thought content normal.  Vitals reviewed.   Activities of Daily Living In your present state of health, do you have any difficulty performing the following activities: 04/08/2018  Hearing? N  Vision? Y  Comment Per pt- eye get tired easily. Pt plans to set up an eye exam this year.   Difficulty concentrating or making decisions? N  Walking or climbing stairs? N  Dressing or bathing? N  Doing errands, shopping? N  Preparing Food and eating ? N  Using the Toilet? N  In the past six months, have you accidently leaked urine? N  Comment Has a pessary.  Do you have problems with loss of bowel control? N  Managing your Medications? N  Managing your Finances? N  Housekeeping or managing your Housekeeping? N  Some recent data might be hidden    Fall Risk Assessment Fall Risk  04/08/2018 07/11/2017 04/06/2017 03/20/2016  Falls in the past year? Yes No No No  Number falls in past yr: 1 - - -  Injury with Fall? No - - -  Follow up Falls prevention discussed - - -     Depression Screen PHQ 2/9 Scores 04/08/2018 04/06/2017 04/06/2017 03/20/2016  PHQ - 2 Score 0 0 0 0  PHQ- 9 Score - 1 - -    Cognitive Testing - 6-CIT  Correct? Score   What year is it? yes 0 0 or 4  What month is it? yes 0 0 or 3  Memorize:    Pia Mau,  44,  [place],      What time is it? (within 1 hour) yes 0 0 or 3  Count backwards from 20 yes 0 0, 2, or 4  Name the months of the year yes 0 0, 2, or 4  Repeat name & address above no 2 0, 2, 4, 6, 8, or 10       TOTAL SCORE  2/28   Interpretation:  Normal  Normal (0-7) Abnormal (8-28)       Assessment & Plan:    Annual Physical Reviewed  patient's Family Medical History Reviewed and updated list of patient's medical providers Assessment of cognitive impairment was done Assessed patient's  functional ability Established a written schedule for health screening [place] Completed and Reviewed  Exercise Activities and Dietary recommendations Goals    . DIET - INCREASE WATER INTAKE     Recommend increasing water intake to 4-6 glasses a day.        Immunization History  Administered Date(s) Administered  . Influenza, High Dose Seasonal PF 07/21/2015, 07/28/2016, 07/11/2017  . Pneumococcal Conjugate-13 08/26/2014  . Pneumococcal Polysaccharide-23 08/11/1999, 07/11/2017  . Td 05/04/1997  . Tdap 08/26/2014  . Zoster 02/12/2012    Health Maintenance  Topic Date Due  . INFLUENZA VACCINE  05/09/2018  . TETANUS/TDAP  08/26/2024  . DEXA SCAN  Completed  . PNA vac Low Risk Adult  Completed     Discussed health benefits of physical activity, and encouraged her to engage in regular exercise appropriate for her age and condition.    1. 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 Asymptomatic. Will check labs as below and f/u pending results. - TSH  2. Hypercholesterolemia Diet controlled. Will check labs as below and f/u pending results. - CBC with Differential/Platelet - Comprehensive metabolic panel - Lipid Panel With LDL/HDL Ratio  3. Chronic obstructive pulmonary disease, unspecified COPD type ([place]) Stable.   4. Malignant melanoma of skin of left lower extremity, including hip ([facility]) Stable. Recently had a squamous cell removed from left shoulder. Followed by Dr. Kellie Moor.   ------------------------------------------------------------------------------------------------------------    [name], PA-C  [facility]

## 2018-04-15 NOTE — Patient Instructions (Signed)
Health Maintenance for Postmenopausal Women Menopause is a normal process in which your reproductive ability comes to an end. This process happens gradually over a span of months to years, usually between the ages of 22 and 9. Menopause is complete when you have missed 12 consecutive menstrual periods. It is important to talk with your health care provider about some of the most common conditions that affect postmenopausal women, such as heart disease, cancer, and bone loss (osteoporosis). Adopting a healthy lifestyle and getting preventive care can help to promote your health and wellness. Those actions can also lower your chances of developing some of these common conditions. What should I know about menopause? During menopause, you may experience a number of symptoms, such as:  Moderate-to-severe hot flashes.  Night sweats.  Decrease in sex drive.  Mood swings.  Headaches.  Tiredness.  Irritability.  Memory problems.  Insomnia.  Choosing to treat or not to treat menopausal changes is an individual decision that you make with your health care provider. What should I know about hormone replacement therapy and supplements? Hormone therapy products are effective for treating symptoms that are associated with menopause, such as hot flashes and night sweats. Hormone replacement carries certain risks, especially as you become older. If you are thinking about using estrogen or estrogen with progestin treatments, discuss the benefits and risks with your health care provider. What should I know about heart disease and stroke? Heart disease, heart attack, and stroke become more likely as you age. This may be due, in part, to the hormonal changes that your body experiences during menopause. These can affect how your body processes dietary fats, triglycerides, and cholesterol. Heart attack and stroke are both medical emergencies. There are many things that you can do to help prevent heart disease  and stroke:  Have your blood pressure checked at least every 1-2 years. High blood pressure causes heart disease and increases the risk of stroke.  If you are 53-22 years old, ask your health care provider if you should take aspirin to prevent a heart attack or a stroke.  Do not use any tobacco products, including cigarettes, chewing tobacco, or electronic cigarettes. If you need help quitting, ask your health care provider.  It is important to eat a healthy diet and maintain a healthy weight. ? Be sure to include plenty of vegetables, fruits, low-fat dairy products, and lean protein. ? Avoid eating foods that are high in solid fats, added sugars, or salt (sodium).  Get regular exercise. This is one of the most important things that you can do for your health. ? Try to exercise for at least 150 minutes each week. The type of exercise that you do should increase your heart rate and make you sweat. This is known as moderate-intensity exercise. ? Try to do strengthening exercises at least twice each week. Do these in addition to the moderate-intensity exercise.  Know your numbers.Ask your health care provider to check your cholesterol and your blood glucose. Continue to have your blood tested as directed by your health care provider.  What should I know about cancer screening? There are several types of cancer. Take the following steps to reduce your risk and to catch any cancer development as early as possible. Breast Cancer  Practice breast self-awareness. ? This means understanding how your breasts normally appear and feel. ? It also means doing regular breast self-exams. Let your health care provider know about any changes, no matter how small.  If you are 40  or older, have a clinician do a breast exam (clinical breast exam or CBE) every year. Depending on your age, family history, and medical history, it may be recommended that you also have a yearly breast X-ray (mammogram).  If you  have a family history of breast cancer, talk with your health care provider about genetic screening.  If you are at high risk for breast cancer, talk with your health care provider about having an MRI and a mammogram every year.  Breast cancer (BRCA) gene test is recommended for women who have family members with BRCA-related cancers. Results of the assessment will determine the need for genetic counseling and BRCA1 and for BRCA2 testing. BRCA-related cancers include these types: ? Breast. This occurs in males or females. ? Ovarian. ? Tubal. This may also be called fallopian tube cancer. ? Cancer of the abdominal or pelvic lining (peritoneal cancer). ? Prostate. ? Pancreatic.  Cervical, Uterine, and Ovarian Cancer Your health care provider may recommend that you be screened regularly for cancer of the pelvic organs. These include your ovaries, uterus, and vagina. This screening involves a pelvic exam, which includes checking for microscopic changes to the surface of your cervix (Pap test).  For women ages 21-65, health care providers may recommend a pelvic exam and a Pap test every three years. For women ages 79-65, they may recommend the Pap test and pelvic exam, combined with testing for human papilloma virus (HPV), every five years. Some types of HPV increase your risk of cervical cancer. Testing for HPV may also be done on women of any age who have unclear Pap test results.  Other health care providers may not recommend any screening for nonpregnant women who are considered low risk for pelvic cancer and have no symptoms. Ask your health care provider if a screening pelvic exam is right for you.  If you have had past treatment for cervical cancer or a condition that could lead to cancer, you need Pap tests and screening for cancer for at least 20 years after your treatment. If Pap tests have been discontinued for you, your risk factors (such as having a new sexual partner) need to be  reassessed to determine if you should start having screenings again. Some women have medical problems that increase the chance of getting cervical cancer. In these cases, your health care provider may recommend that you have screening and Pap tests more often.  If you have a family history of uterine cancer or ovarian cancer, talk with your health care provider about genetic screening.  If you have vaginal bleeding after reaching menopause, tell your health care provider.  There are currently no reliable tests available to screen for ovarian cancer.  Lung Cancer Lung cancer screening is recommended for adults 69-62 years old who are at high risk for lung cancer because of a history of smoking. A yearly low-dose CT scan of the lungs is recommended if you:  Currently smoke.  Have a history of at least 30 pack-years of smoking and you currently smoke or have quit within the past 15 years. A pack-year is smoking an average of one pack of cigarettes per day for one year.  Yearly screening should:  Continue until it has been 15 years since you quit.  Stop if you develop a health problem that would prevent you from having lung cancer treatment.  Colorectal Cancer  This type of cancer can be detected and can often be prevented.  Routine colorectal cancer screening usually begins at  age 42 and continues through age 45.  If you have risk factors for colon cancer, your health care provider may recommend that you be screened at an earlier age.  If you have a family history of colorectal cancer, talk with your health care provider about genetic screening.  Your health care provider may also recommend using home test kits to check for hidden blood in your stool.  A small camera at the end of a tube can be used to examine your colon directly (sigmoidoscopy or colonoscopy). This is done to check for the earliest forms of colorectal cancer.  Direct examination of the colon should be repeated every  5-10 years until age 71. However, if early forms of precancerous polyps or small growths are found or if you have a family history or genetic risk for colorectal cancer, you may need to be screened more often.  Skin Cancer  Check your skin from head to toe regularly.  Monitor any moles. Be sure to tell your health care provider: ? About any new moles or changes in moles, especially if there is a change in a mole's shape or color. ? If you have a mole that is larger than the size of a pencil eraser.  If any of your family members has a history of skin cancer, especially at a young age, talk with your health care provider about genetic screening.  Always use sunscreen. Apply sunscreen liberally and repeatedly throughout the day.  Whenever you are outside, protect yourself by wearing long sleeves, pants, a wide-brimmed hat, and sunglasses.  What should I know about osteoporosis? Osteoporosis is a condition in which bone destruction happens more quickly than new bone creation. After menopause, you may be at an increased risk for osteoporosis. To help prevent osteoporosis or the bone fractures that can happen because of osteoporosis, the following is recommended:  If you are 46-71 years old, get at least 1,000 mg of calcium and at least 600 mg of vitamin D per day.  If you are older than age 55 but younger than age 65, get at least 1,200 mg of calcium and at least 600 mg of vitamin D per day.  If you are older than age 54, get at least 1,200 mg of calcium and at least 800 mg of vitamin D per day.  Smoking and excessive alcohol intake increase the risk of osteoporosis. Eat foods that are rich in calcium and vitamin D, and do weight-bearing exercises several times each week as directed by your health care provider. What should I know about how menopause affects my mental health? Depression may occur at any age, but it is more common as you become older. Common symptoms of depression  include:  Low or sad mood.  Changes in sleep patterns.  Changes in appetite or eating patterns.  Feeling an overall lack of motivation or enjoyment of activities that you previously enjoyed.  Frequent crying spells.  Talk with your health care provider if you think that you are experiencing depression. What should I know about immunizations? It is important that you get and maintain your immunizations. These include:  Tetanus, diphtheria, and pertussis (Tdap) booster vaccine.  Influenza every year before the flu season begins.  Pneumonia vaccine.  Shingles vaccine.  Your health care provider may also recommend other immunizations. This information is not intended to replace advice given to you by your health care provider. Make sure you discuss any questions you have with your health care provider. Document Released: 11/17/2005  Document Revised: 04/14/2016 Document Reviewed: 06/29/2015 Elsevier Interactive Patient Education  2018 [employer]  

## 2018-04-16 ENCOUNTER — Telehealth: Payer: Self-pay

## 2018-04-16 ENCOUNTER — Encounter: Payer: Medicare Other | Admitting: Physician Assistant

## 2018-04-16 LAB — CBC WITH DIFFERENTIAL/PLATELET
Basophils Absolute: 0 10*3/uL (ref 0.0–0.2)
Basos: 0 %
EOS (ABSOLUTE): 0.1 10*3/uL (ref 0.0–0.4)
EOS: 1 %
HEMATOCRIT: 45 % (ref 34.0–46.6)
Hemoglobin: 14.1 g/dL (ref 11.1–15.9)
IMMATURE GRANS (ABS): 0 10*3/uL (ref 0.0–0.1)
IMMATURE GRANULOCYTES: 0 %
LYMPHS: 26 %
Lymphocytes Absolute: 2 10*3/uL (ref 0.7–3.1)
MCH: 28.1 pg (ref 26.6–33.0)
MCHC: 31.3 g/dL — ABNORMAL LOW (ref 31.5–35.7)
MCV: 90 fL (ref 79–97)
Monocytes Absolute: 0.6 10*3/uL (ref 0.1–0.9)
Monocytes: 8 %
NEUTROS PCT: 65 %
Neutrophils Absolute: 5 10*3/uL (ref 1.4–7.0)
PLATELETS: 281 10*3/uL (ref 150–450)
RBC: 5.01 x10E6/uL (ref 3.77–5.28)
RDW: 14.1 % (ref 12.3–15.4)
WBC: 7.7 10*3/uL (ref 3.4–10.8)

## 2018-04-16 LAB — TSH: TSH: 4.88 u[IU]/mL — AB (ref 0.450–4.500)

## 2018-04-16 LAB — LIPID PANEL WITH LDL/HDL RATIO
CHOLESTEROL TOTAL: 224 mg/dL — AB (ref 100–199)
HDL: 64 mg/dL (ref >39)
LDL CALC: 138 mg/dL — AB (ref 0–99)
LDl/HDL Ratio: 2.2 ratio (ref 0.0–3.2)
Triglycerides: 112 mg/dL (ref 0–149)
VLDL CHOLESTEROL CAL: 22 mg/dL (ref 5–40)

## 2018-04-16 LAB — COMPREHENSIVE METABOLIC PANEL
A/G RATIO: 1.8 (ref 1.2–2.2)
ALT: 17 IU/L (ref 0–32)
AST: 23 IU/L (ref 0–40)
Albumin: 4.3 g/dL (ref 3.5–4.8)
Alkaline Phosphatase: 74 IU/L (ref 39–117)
BUN/Creatinine Ratio: 9 — ABNORMAL LOW (ref 12–28)
BUN: 7 mg/dL — ABNORMAL LOW (ref 8–27)
Bilirubin Total: 0.4 mg/dL (ref 0.0–1.2)
CALCIUM: 10.2 mg/dL (ref 8.7–10.3)
CHLORIDE: 98 mmol/L (ref 96–106)
CO2: 25 mmol/L (ref 20–29)
Creatinine, Ser: 0.82 mg/dL (ref 0.57–1.00)
GFR calc Af Amer: 79 mL/min/{1.73_m2} (ref >59)
GFR calc non Af Amer: 68 mL/min/{1.73_m2} (ref >59)
Globulin, Total: 2.4 g/dL (ref 1.5–4.5)
Glucose: 87 mg/dL (ref 65–99)
POTASSIUM: 4.9 mmol/L (ref 3.5–5.2)
Sodium: 139 mmol/L (ref 134–144)
TOTAL PROTEIN: 6.7 g/dL (ref 6.0–8.5)

## 2018-04-16 NOTE — Telephone Encounter (Signed)
-----   Message from [name], PA-C sent at 04/16/2018  1:21 PM EDT ----- Cholesterol slightly elevated but stable. Thyroid stable. Blood count normal. Kidney and liver function normal. Sugar normal.

## 2018-04-16 NOTE — Telephone Encounter (Signed)
Patient advised as below.  

## 2018-04-18 DIAGNOSIS — C4441 Basal cell carcinoma of skin of scalp and neck: Secondary | ICD-10-CM | POA: Diagnosis not present

## 2018-04-18 DIAGNOSIS — C44519 Basal cell carcinoma of skin of other part of trunk: Secondary | ICD-10-CM | POA: Diagnosis not present

## 2018-04-22 ENCOUNTER — Ambulatory Visit (INDEPENDENT_AMBULATORY_CARE_PROVIDER_SITE_OTHER): Payer: Medicare Other | Admitting: Physician Assistant

## 2018-04-22 ENCOUNTER — Encounter: Payer: Self-pay | Admitting: Physician Assistant

## 2018-04-22 VITALS — BP 120/80 | HR 67 | Temp 98.7°F | Resp 16 | Wt 146.8 lb

## 2018-04-22 DIAGNOSIS — S46211A Strain of muscle, fascia and tendon of other parts of biceps, right arm, initial encounter: Secondary | ICD-10-CM | POA: Diagnosis not present

## 2018-04-22 DIAGNOSIS — M66821 Spontaneous rupture of other tendons, right upper arm: Secondary | ICD-10-CM | POA: Diagnosis not present

## 2018-04-22 DIAGNOSIS — M79601 Pain in right arm: Secondary | ICD-10-CM | POA: Diagnosis not present

## 2018-04-22 MED ORDER — METHYLPREDNISOLONE 4 MG PO TBPK: ORAL_TABLET | ORAL | 0 refills | Status: DC

## 2018-04-22 NOTE — Progress Notes (Signed)
Patient: Carla Sanchez Female    DOB: 1936-08-21   82 y.o.   MRN: [id number] Visit Date: 04/22/2018  Today's Provider: [name], PA-C   Chief Complaint  Patient presents with  . Arm Injury   Subjective:    Arm Injury   The incident occurred 3 to 5 days ago (It happened on Friday). The incident occurred at home (she reports that she was cleaning her refrigetor). The pain is present in the upper right arm. The quality of the pain is described as aching. The pain radiates to the right arm. The pain is at a severity of 5/10. The pain is moderate. The pain has been constant since the incident. Associated symptoms include tingling. Pertinent negatives include no numbness. Associated symptoms comments: Swelling. The symptoms are aggravated by lifting. She has tried heat and ice (Aleve) for the symptoms. The treatment provided no relief.   Was cleaning out her refrigerator and was pulling back a shelf and felt pop in right arm. Immediate deformity with pain and bruising.     Allergies  Allergen Reactions  . Celebrex [Celecoxib]      Current Outpatient Medications:  .  ascorbic acid (VITAMIN C) 500 MG tablet, Take 500 mg by mouth daily., Disp: , Rfl:  .  aspirin 81 MG tablet, Take 81 mg by mouth daily., Disp: , Rfl:  .  Biotin 10 MG CAPS, Take by mouth daily. , Disp: , Rfl:  .  Calcium Carb-Cholecalciferol (CALCIUM CARBONATE-VITAMIN D3) 600-400 MG-UNIT TABS, Take 1 tablet by mouth daily., Disp: , Rfl:  .  co-enzyme Q-10 30 MG capsule, Take 30 mg by mouth daily. , Disp: , Rfl:  .  Lysine 500 MG TABS, Take by mouth daily. , Disp: , Rfl:  .  Multiple Vitamin (MULTIVITAMIN) tablet, Take 1 tablet by mouth daily., Disp: , Rfl:  .  Omega 3 1000 MG CAPS, Take 1 capsule by mouth daily. , Disp: , Rfl:  .  OXYQUINOLONE SULFATE VAGINAL (TRIMO-SAN) 0.025 % GEL, Place 1 applicator vaginally once a week. , Disp: , Rfl:  .  pyridoxine (B-6) 100 MG tablet, Take 100 mg by mouth  daily., Disp: , Rfl:  .  vitamin B-12 (CYANOCOBALAMIN) 1000 MCG tablet, Take 1,000 mcg by mouth daily., Disp: , Rfl:   Review of Systems  Constitutional: Negative.   Respiratory: Negative.   Cardiovascular: Negative.   Musculoskeletal: Positive for arthralgias and myalgias.  Neurological: Positive for tingling and weakness. Negative for numbness.    Social History   Tobacco Use  . Smoking status: Never Smoker  . Smokeless tobacco: Never Used  Substance Use Topics  . Alcohol use: Yes    Comment: 1-2 drinks occasionally   Objective:   BP 120/80 (BP Location: Left Arm, Patient Position: Sitting, Cuff Size: Normal)   Pulse 67   Temp 98.7 F (37.1 C) (Oral)   Resp 16   Wt 146 lb 12.8 oz (66.6 kg)   BMI 26.00 kg/m  Vitals:   04/22/18 0853  BP: 120/80  Pulse: 67  Resp: 16  Temp: 98.7 F (37.1 C)  TempSrc: Oral  Weight: 146 lb 12.8 oz (66.6 kg)     Physical Exam  Constitutional: She appears well-developed and well-nourished. No distress.  Neck: Normal range of motion. Neck supple.  Cardiovascular: Normal rate, regular rhythm and normal heart sounds. Exam reveals no gallop and no friction rub.  No murmur heard. Pulmonary/Chest: Effort normal and breath sounds normal.  No respiratory distress. She has no wheezes. She has no rales.  Musculoskeletal:       Right shoulder: She exhibits tenderness, deformity and decreased strength. She exhibits normal range of motion and normal pulse.       Right elbow: She exhibits swelling and deformity. She exhibits normal range of motion. No tenderness found.       Arms: Skin: She is not diaphoretic.  Vitals reviewed.         Assessment & Plan:     1. Biceps rupture, proximal, right, initial encounter Medrol dose pak given for inflammation. Continue alternating ice and heat. Avoid any heavy lifting at this time. Urgent referral made to orthopedics as below for further consideration. She is to call if she develops any numbness or  coldness to the arm.  - Ambulatory referral to Orthopedic Surgery - methylPREDNISolone (MEDROL) 4 MG TBPK tablet; 6 day taper; take as directed on package instructions  Dispense: 21 tablet; Refill: 0       [name], PA-C  [facility]

## 2018-04-22 NOTE — Patient Instructions (Signed)
Biceps Tendon Disruption (Proximal) The proximal biceps tendon is a strong cord of tissue that connects the biceps muscle, on the front of the upper arm, to the shoulder blade. A proximal biceps tendon disruption can include a partial or complete tear of the tendon near where it connects to the bone near the shoulder. A proximal biceps tendon disruption can interfere with the ability to lift the arm in front of the body, stabilize the shoulder, bend the elbow, and turn the hand palm-up (supination). What are the causes? A biceps tendon disruption happens when the tendon is exposed to too much force. This excess force may be caused by:  The elbow being suddenly straightened from a bent position because of an external force. This could happen, for example, while catching a heavy weight or being pulled when waterskiing.  Wear and tear from physical activity.  Breaking a fall with your hand.  What increases the risk? The following factors may make you more likely to develop this condition:  Playing contact sports.  Doing activities or sports that involve throwing or overhead movements, such as racket sports, gymnastics, or baseball.  Doing activities or sports that involve putting sudden force on the arm, such as weightlifting or waterskiing.  Having a weakened tendon. The tendon may be weak because of: ? Long-lasting (chronic) biceps tendinitis. ? Certain medical conditions, such as diabetes or rheumatoid arthritis. ? Repeated corticosteroid use. ? Repetitive overhead movements.  What are the signs or symptoms? Symptoms of this condition may include:  Sudden sharp pain in the front of the shoulder. Pain may get worse during certain movements, such as: ? Lifting or carrying objects. ? Straightening the elbow. ? Throwing or using overhead movements.  Inflammation or a feeling of unusual warmth on the front of the shoulder.  Painful tightening (spasm) of the biceps muscle.  A bulge on  the inside of the upper arm when the elbow is bent.  Bruising in the shoulder or upper arm. This may develop 24-48 hours after the tendon is injured.  Limited range of motion of the shoulder and elbow.  Weakness in the elbow and forearm when: ? Bending the elbow. ? Rotating the wrist.  How is this diagnosed? This condition is diagnosed based on your symptoms, your medical history, and a physical exam. Your health care provider may test the strength and range of motion of your shoulder and elbow. You may have imaging tests, such as X-rays, MRI, or ultrasound. How is this treated? This condition is treated by resting and icing the injured area, and by doing physical therapy exercises. Depending on the severity of your condition, treatment may also include:  Medicines to help relieve pain and inflammation.  Avoiding certain activities that put stress on your shoulder.  One or more injections of medicines (corticosteroids) into your upper arm to help reduce inflammation (rare).  Surgery to repair the tear. This may be needed if nonsurgical treatments do not improve your condition.  Follow these instructions at home: Managing pain, stiffness, and swelling  If directed, put ice on the injured area: ? Put ice in a plastic bag. ? Place a towel between your skin and the bag. ? Leave the ice on for 20 minutes, 2-3 times a day.  Move your fingers often to avoid stiffness and to lessen swelling.  Raise (elevate) the injured area while you are sitting or lying down. Activity  Return to your normal activities as told by your health care provider. Ask your health   care provider what activities are safe for you.  Avoid activities that cause pain or make your condition worse.  Do not lift anything that is heavier than 10 lb (4.5 kg) until your health care provider approves.  Do exercises as told by your health care provider. General instructions  Take over-the-counter and prescription  medicines only as told by your health care provider.  Do not drive or operate heavy machinery while taking prescription pain medicines.  Keep all follow-up visits as told by your health care provider. This is important. How is this prevented?  Warm up and stretch before being active.  Cool down and stretch after being active.  Give your body time to rest between periods of activity.  Make sure to use equipment that fits you.  Be safe and responsible while being active to avoid falls.  Maintain physical fitness, including strength and flexibility. Contact a health care provider if:  You have symptoms that get worse or do not get better after 2 weeks of treatment.  You develop new symptoms. Get help right away if:  You have severe pain.  You develop pain or numbness in your hand.  Your hand feels unusually cold.  Your fingernails turn a dark color, such as blue or gray. This information is not intended to replace advice given to you by your health care provider. Make sure you discuss any questions you have with your health care provider. Document Released: 09/25/2005 Document Revised: 06/01/2016 Document Reviewed: 09/03/2015 Elsevier Interactive Patient Education  2018 [employer]vier Inc[employer]  

## 2018-05-06 DIAGNOSIS — H26493 Other secondary cataract, bilateral: Secondary | ICD-10-CM | POA: Diagnosis not present

## 2018-05-06 DIAGNOSIS — H04123 Dry eye syndrome of bilateral lacrimal glands: Secondary | ICD-10-CM | POA: Diagnosis not present

## 2018-05-06 DIAGNOSIS — Z9842 Cataract extraction status, left eye: Secondary | ICD-10-CM | POA: Diagnosis not present

## 2018-05-06 DIAGNOSIS — Z9841 Cataract extraction status, right eye: Secondary | ICD-10-CM | POA: Diagnosis not present

## 2018-06-18 ENCOUNTER — Encounter: Payer: Medicare Other | Admitting: Obstetrics and Gynecology

## 2018-06-25 ENCOUNTER — Ambulatory Visit (INDEPENDENT_AMBULATORY_CARE_PROVIDER_SITE_OTHER): Payer: Medicare Other | Admitting: Obstetrics and Gynecology

## 2018-06-25 ENCOUNTER — Encounter: Payer: Self-pay | Admitting: Obstetrics and Gynecology

## 2018-06-25 VITALS — BP 112/72 | HR 63 | Ht 63.0 in | Wt 144.3 lb

## 2018-06-25 DIAGNOSIS — K5909 Other constipation: Secondary | ICD-10-CM

## 2018-06-25 DIAGNOSIS — N8111 Cystocele, midline: Secondary | ICD-10-CM

## 2018-06-25 DIAGNOSIS — N816 Rectocele: Secondary | ICD-10-CM | POA: Diagnosis not present

## 2018-06-25 DIAGNOSIS — Z4689 Encounter for fitting and adjustment of other specified devices: Secondary | ICD-10-CM | POA: Diagnosis not present

## 2018-06-25 MED ORDER — OXYQUINOLONE SULFATE 0.025 % VA GEL: 0.2500 | VAGINAL | 3 refills | Status: DC

## 2018-06-25 NOTE — Patient Instructions (Addendum)
1.  Return in 6 months for pessary maintenance 2.  Continue using Trimosan gel intravaginal weekly 3.  Notify the office if significant vaginal discharge with odor presents, possible treatment with an antibiotic intravaginal cream will be considered (MetroGel)

## 2018-06-25 NOTE — Progress Notes (Signed)
Chief complaint: 1.  Pessary maintenance 2.  Midline cystocele 3.  Rectocele 4.  History of chronic constipation, resolved  Pessary maintenance-16 weeks (#5 ring with diaphragm support pessary) Trimosan gel weekly intravaginal, without problems. No history of vaginal bleeding or pelvic pain.  She does report mild vaginal discharge with odor. Bowel function is normal without constipation. Bladder function is normal.;  She is not experiencing any significant incontinence. No nocturia is noted while pessary is in place. Patient is caring for pessary herself and takes a break up to 3 days a time during use.  Both she and husband are comfortable inserting and removing pessary.  Past medical history, past surgical history, problem list, medications, and allergies are reviewed  OBJECTIVE: BP 112/72   Pulse 63   Ht 5\' 3"  (1.6 m)   Wt 144 lb 4.8 oz (65.5 kg)   BMI 25.56 kg/m  Pleasant well-appearing female no acute distress.  Alert and oriented. Affect is appropriate. Abdomen: Soft, nontender without organomegaly Pelvic exam: External genitalia-normal BUS-normal Vagina-mild atrophy is present; there is good support at the urethrovesical junction; second to third-degree cystocele with Valsalva as noted.  Moderate rectocele is present.  There is no significant discharge.  No evidence of vulvar ulceration or abrasion. Cervix-surgically absent Uterus-surgically absent Adnexa-not palpated and nontender Rectovaginal-normal external exam Bladder-nontender  PROCEDURE: #5 ring with diaphragm support pessary is removed, cleaned, and reinserted.  ASSESSMENT: 1.  Rectocele, moderate, asymptomatic with pessary use 2.  History of chronic constipation, minimally symptomatic 3.  Cystocele, second to third-degree, asymptomatic with pessary use 4.  Status post pubovaginal sling 2009, without incontinence symptoms 5.  Vaginal atrophy, moderate, stable 6.  Normal pessary maintenance exam 7.  History  of mild vaginal discharge with odor, not present today.  PLAN: 1.  Pessary is removed, cleaned, and reinserted. 2.  Return in 6 months for pessary maintenance or sooner if difficulties with pessary arise. 3.  Continue using Trimosan gel intravaginal weekly 4.  If vaginal odor is persistent, contact office for consideration of MetroGel therapy  A total of 15 minutes were spent face-to-face with the patient during this encounter and over half of that time dealt with counseling and coordination of care.  Brayton Mars, MD  Note: This dictation was prepared with Dragon dictation along with smaller phrase technology. Any transcriptional errors that result from this process are unintentional.

## 2018-07-31 DIAGNOSIS — D485 Neoplasm of uncertain behavior of skin: Secondary | ICD-10-CM | POA: Diagnosis not present

## 2018-07-31 DIAGNOSIS — D2272 Melanocytic nevi of left lower limb, including hip: Secondary | ICD-10-CM | POA: Diagnosis not present

## 2018-07-31 DIAGNOSIS — Z85820 Personal history of malignant melanoma of skin: Secondary | ICD-10-CM | POA: Diagnosis not present

## 2018-07-31 DIAGNOSIS — L821 Other seborrheic keratosis: Secondary | ICD-10-CM | POA: Diagnosis not present

## 2018-07-31 DIAGNOSIS — L308 Other specified dermatitis: Secondary | ICD-10-CM | POA: Diagnosis not present

## 2018-07-31 DIAGNOSIS — D225 Melanocytic nevi of trunk: Secondary | ICD-10-CM | POA: Diagnosis not present

## 2018-07-31 DIAGNOSIS — Z85828 Personal history of other malignant neoplasm of skin: Secondary | ICD-10-CM | POA: Diagnosis not present

## 2018-07-31 DIAGNOSIS — D2262 Melanocytic nevi of left upper limb, including shoulder: Secondary | ICD-10-CM | POA: Diagnosis not present

## 2018-07-31 DIAGNOSIS — Z08 Encounter for follow-up examination after completed treatment for malignant neoplasm: Secondary | ICD-10-CM | POA: Diagnosis not present

## 2018-07-31 DIAGNOSIS — D2271 Melanocytic nevi of right lower limb, including hip: Secondary | ICD-10-CM | POA: Diagnosis not present

## 2018-07-31 DIAGNOSIS — D2261 Melanocytic nevi of right upper limb, including shoulder: Secondary | ICD-10-CM | POA: Diagnosis not present

## 2018-08-06 DIAGNOSIS — H18413 Arcus senilis, bilateral: Secondary | ICD-10-CM | POA: Diagnosis not present

## 2018-08-06 DIAGNOSIS — Z961 Presence of intraocular lens: Secondary | ICD-10-CM | POA: Diagnosis not present

## 2018-08-06 DIAGNOSIS — H26492 Other secondary cataract, left eye: Secondary | ICD-10-CM | POA: Diagnosis not present

## 2018-08-06 DIAGNOSIS — H02839 Dermatochalasis of unspecified eye, unspecified eyelid: Secondary | ICD-10-CM | POA: Diagnosis not present

## 2018-08-08 ENCOUNTER — Ambulatory Visit (INDEPENDENT_AMBULATORY_CARE_PROVIDER_SITE_OTHER): Payer: Medicare Other

## 2018-08-08 DIAGNOSIS — Z23 Encounter for immunization: Secondary | ICD-10-CM | POA: Diagnosis not present

## 2018-08-27 DIAGNOSIS — Z961 Presence of intraocular lens: Secondary | ICD-10-CM | POA: Diagnosis not present

## 2018-08-27 DIAGNOSIS — H26491 Other secondary cataract, right eye: Secondary | ICD-10-CM | POA: Diagnosis not present

## 2018-12-17 ENCOUNTER — Encounter: Payer: Medicare Other | Admitting: Obstetrics and Gynecology

## 2018-12-17 ENCOUNTER — Ambulatory Visit (INDEPENDENT_AMBULATORY_CARE_PROVIDER_SITE_OTHER): Payer: Medicare Other | Admitting: Obstetrics and Gynecology

## 2018-12-17 ENCOUNTER — Encounter: Payer: Self-pay | Admitting: Obstetrics and Gynecology

## 2018-12-17 VITALS — BP 124/77 | HR 63 | Ht 63.0 in | Wt 149.0 lb

## 2018-12-17 DIAGNOSIS — N816 Rectocele: Secondary | ICD-10-CM

## 2018-12-17 DIAGNOSIS — Z4689 Encounter for fitting and adjustment of other specified devices: Secondary | ICD-10-CM

## 2018-12-17 DIAGNOSIS — N8111 Cystocele, midline: Secondary | ICD-10-CM

## 2018-12-17 DIAGNOSIS — N952 Postmenopausal atrophic vaginitis: Secondary | ICD-10-CM | POA: Diagnosis not present

## 2018-12-17 NOTE — Progress Notes (Signed)
    GYNECOLOGY PROGRESS NOTE  Subjective:    Patient ID: Carla Sanchez, female    DOB: [birth year], 83 y.o.   MRN: [id number]  HPI  Patient is a 83 y.o. G44P4 female who presents for a 6 month pessary check. She reports no vaginal bleeding or discharge. She denies pelvic discomfort and difficulty urinating or moving her bowels. She has no issues with self-maintenance of the pessary at home, noting that she cleans it ~ 2-3 time weekly. She has questions regarding management of her pessary when she goes to the beach (specifically using hot tubs).    The following portions of the patient's history were reviewed and updated as appropriate: allergies, current medications, past family history, past medical history, past social history, past surgical history and problem list.   Review of Systems Pertinent items noted in HPI and remainder of comprehensive ROS otherwise negative.   Objective:   Blood pressure 124/77, pulse 63, height 5\' 3"  (1.6 m), weight 149 lb (67.6 kg). General appearance: alert and no distress Abdomen: soft, non-tender; bowel sounds normal; no masses,  no organomegaly Pelvic: The patient's Size (#5 ring with diaphragm support pessary was removed, cleaned and replaced without complications. Speculum examination revealed normal vaginal mucosa with no lesions or lacerations. Mild vaginal atrophy.    Assessment:   Pessary maintenance Cystocele (second to third degree) Rectocele (first to second degree) Vaginal atrophy  Plan:   - The patient should return in 6 months for a pessary check and continue to use Trimosan gel weekly as prescribed. - Discussed management of cleaning of pessary while at the beach.  - Mild vaginal atrophy, asymptomatic.   Rubie Maid, MD [facility]

## 2018-12-17 NOTE — Progress Notes (Signed)
Pt is present today for pessary check. Pt stated that the pessary is working well for her. Pt stated that she was doing well no problems or concerns to report at this time.

## 2018-12-24 ENCOUNTER — Encounter: Payer: Medicare Other | Admitting: Obstetrics and Gynecology

## 2019-03-17 DIAGNOSIS — H9202 Otalgia, left ear: Secondary | ICD-10-CM | POA: Diagnosis not present

## 2019-03-17 DIAGNOSIS — M26629 Arthralgia of temporomandibular joint, unspecified side: Secondary | ICD-10-CM | POA: Diagnosis not present

## 2019-03-17 DIAGNOSIS — H903 Sensorineural hearing loss, bilateral: Secondary | ICD-10-CM | POA: Diagnosis not present

## 2019-04-10 ENCOUNTER — Other Ambulatory Visit: Payer: Self-pay

## 2019-04-10 ENCOUNTER — Ambulatory Visit (INDEPENDENT_AMBULATORY_CARE_PROVIDER_SITE_OTHER): Payer: Medicare Other

## 2019-04-10 DIAGNOSIS — Z0000 Encounter for general adult medical examination without abnormal findings: Secondary | ICD-10-CM | POA: Diagnosis not present

## 2019-04-10 NOTE — Progress Notes (Addendum)
Subjective:   Carla Sanchez is a 83 y.o. female who presents for Medicare Annual (Subsequent) preventive examination.    This visit is being conducted through telemedicine due to the COVID-19 pandemic. This patient has given me verbal consent via doximity to conduct this visit, patient states they are participating from their home address. Some vital signs may be absent or patient reported.    Patient identification: identified by name, DOB, and current address  Review of Systems:  N/A  Cardiac Risk Factors include: advanced age (>6men, >18 women)     Objective:     Vitals: There were no vitals taken for this visit.  There is no height or weight on file to calculate BMI. Unable to obtain vitals due to visit being conducted via telephonically.   Advanced Directives 04/10/2019 04/08/2018 04/06/2017 03/20/2016 10/13/2015  Does Patient Have a Medical Advance Directive? Yes Yes Yes Yes Yes  Type of Paramedic of [place];Living will [place];Living will Living will;Healthcare Power of [place];Living will Living will;Healthcare Power of [place] in Chart? Yes - validated most recent copy scanned in chart (See row information) No - copy requested No - copy requested - -    Tobacco Social History   Tobacco Use  Smoking Status Never Smoker  Smokeless Tobacco Never Used     Counseling given: Not Answered   Clinical Intake:  Pre-visit preparation completed: Yes  Pain Score: 0-No pain     Nutritional Risks: None Diabetes: No  How often do you need to have someone help you when you read instructions, pamphlets, or other written materials from your doctor or pharmacy?: 1 - Never  Interpreter Needed?: No  Information entered by :: [facility], LPN  Past Medical History:  Diagnosis Date  . Arthritis   . BCC (basal cell carcinoma) 04/18/2018  . Cancer ([place])    Malignant Melanoma  . Endometriosis   . Hemorrhoids   . Migraines   . SCCA (squamous cell carcinoma) of skin 04/04/2018   removed from left shoulder   Past Surgical History:  Procedure Laterality Date  . ABDOMINAL HYSTERECTOMY     in her 50's  . bladder sugery  2008   sling-  dr Elenor Quinones  . Bladder Tack    . BREAST SURGERY     Bilateral Mastectomy  . COLONOSCOPY WITH PROPOFOL N/A 03/29/2015   Procedure: COLONOSCOPY WITH PROPOFOL;  Surgeon: Lollie Sails, MD;  Location: [facility];  Service: Endoscopy;  Laterality: N/A;  . HAND SURGERY Right 04/2015  . HEMORRHOIDECTOMY WITH HEMORRHOID BANDING    . JOINT REPLACEMENT     RT TKR; LT TKR; RT Total Hip Arthroplasty; LT Hip Arthroplasty  . LUNG SURGERY     at age 76, also removed a rib   Family History  Problem Relation Age of Onset  . Heart failure Mother   . Arthritis Mother   . Hypertension Father   . Heart attack Father   . Seizures Brother   . Seizures Sister   . Kidney disease Sister   . Cancer Sister        lymphoma  . Cancer Sister   . Breast cancer Sister   . Cancer Brother        prostate  . Heart disease Brother 44       heart attack  . Arthritis Other   . Heart attack Other   . Epilepsy Other   .  Thyroid cancer Other   . Myasthenia gravis Other   . Prostate cancer Other   . Cancer Niece        breast cancer   Social History   Socioeconomic History  . Marital status: Married    Spouse name: Not on file  . Number of children: 4  . Years of education: Not on file  . Highest education level: 12th grade  Occupational History  . Not on file  Social Needs  . Financial resource strain: Not hard at all  . Food insecurity    Worry: Never true    Inability: Never true  . Transportation needs    Medical: No    Non-medical: No  Tobacco Use  . Smoking status: Never Smoker  . Smokeless tobacco: Never Used  Substance and Sexual Activity  . Alcohol use: Yes    Comment: 1-2 drinks rare  . Drug use: No   . Sexual activity: Yes    Birth control/protection: Surgical  Lifestyle  . Physical activity    Days per week: 0 days    Minutes per session: 0 min  . Stress: Only a little  Relationships  . Social Herbalist on phone: Patient refused    Gets together: Patient refused    Attends religious service: Patient refused    Active member of club or organization: Patient refused    Attends meetings of clubs or organizations: Patient refused    Relationship status: Patient refused  Other Topics Concern  . Not on file  Social History Narrative  . Not on file    Outpatient Encounter Medications as of 04/10/2019  Medication Sig  . ascorbic acid (VITAMIN C) 500 MG tablet Take 500 mg by mouth daily.  Marland Kitchen aspirin 81 MG tablet Take 81 mg by mouth daily.  . Biotin 10 MG CAPS Take by mouth daily.   . Calcium Carb-Cholecalciferol (CALCIUM CARBONATE-VITAMIN D3) 600-400 MG-UNIT TABS Take 1 tablet by mouth daily.  Marland Kitchen co-enzyme Q-10 30 MG capsule Take 30 mg by mouth daily.   . Multiple Vitamin (MULTIVITAMIN) tablet Take 1 tablet by mouth daily.  . Omega 3 1000 MG CAPS Take 1 capsule by mouth daily.   Levin Erp SULFATE VAGINAL (TRIMO-SAN) 0.025 % GEL Place 0.35 applicators vaginally once a week.  . pyridoxine (B-6) 100 MG tablet Take 100 mg by mouth daily.  . vitamin B-12 (CYANOCOBALAMIN) 1000 MCG tablet Take 1,000 mcg by mouth daily.   No facility-administered encounter medications on file as of 04/10/2019.     Activities of Daily Living In your present state of health, do you have any difficulty performing the following activities: 04/10/2019  Hearing? N  Vision? N  Comment Wears eye glasses.  Difficulty concentrating or making decisions? N  Walking or climbing stairs? N  Dressing or bathing? N  Doing errands, shopping? N  Preparing Food and eating ? N  Using the Toilet? N  In the past six months, have you accidently leaked urine? N  Comment Had a bladder tact years ago.  Do you  have problems with loss of bowel control? N  Managing your Medications? N  Managing your Finances? N  Housekeeping or managing your Housekeeping? N  Some recent data might be hidden    Patient Care Team: [name], PA-C as PCP - General (Family Medicine) Thelma Comp, OD as Consulting Physician (Optometry) Oneta Rack, MD as Consulting Physician (Dermatology) Rubie Maid, MD as Referring Physician (Obstetrics and Gynecology)  Assessment:   This is a routine wellness examination for [place].  Exercise Activities and Dietary recommendations Current Exercise Habits: The patient does not participate in regular exercise at present(Walks occasionally), Exercise limited by: None identified  Goals    . DIET - INCREASE WATER INTAKE     Recommend increasing water intake to 4-6 glasses a day.     . Increase water intake     Recommend increasing water intake to 4-6 glasses a day.        Fall Risk: Fall Risk  04/10/2019 04/08/2018 07/11/2017 04/06/2017 03/20/2016  Falls in the past year? 0 Yes No No No  Number falls in past yr: - 1 - - -  Injury with Fall? - No - - -  Follow up - Falls prevention discussed - - -    FALL RISK PREVENTION PERTAINING TO THE HOME:  Any stairs in or around the home? Yes  If so, are there any without handrails? No   Home free of loose throw rugs in walkways, pet beds, electrical cords, etc? Yes  Adequate lighting in your home to reduce risk of falls? Yes   ASSISTIVE DEVICES UTILIZED TO PREVENT FALLS:  Life alert? No  Use of a cane, walker or w/c? No  Grab bars in the bathroom? Yes  Shower chair or bench in shower? Yes  Elevated toilet seat or a handicapped toilet? Yes    TIMED UP AND GO:  Was the test performed? No .    Depression Screen PHQ 2/9 Scores 04/10/2019 04/08/2018 04/06/2017 04/06/2017  PHQ - 2 Score 0 0 0 0  PHQ- 9 Score - - 1 -     Cognitive Function:     6CIT Screen 04/10/2019 04/06/2017  What Year? 0 points 0  points  What month? 0 points 0 points  What time? 0 points 0 points  Count back from 20 0 points 0 points  Months in reverse 0 points 0 points  Repeat phrase 4 points 2 points  Total Score 4 2    Immunization History  Administered Date(s) Administered  . Influenza, High Dose Seasonal PF 07/21/2015, 07/28/2016, 07/11/2017, 08/08/2018  . Pneumococcal Conjugate-13 08/26/2014  . Pneumococcal Polysaccharide-23 08/11/1999, 07/11/2017  . Td 05/04/1997  . Tdap 08/26/2014  . Zoster 02/12/2012    Qualifies for Shingles Vaccine? Yes  Zostavax completed 02/12/12. Due for Shingrix. Education has been provided regarding the importance of this vaccine. Pt has been advised to call insurance company to determine out of pocket expense. Advised may also receive vaccine at local pharmacy or Health Dept. Verbalized acceptance and understanding.  Tdap: Up to date  Flu Vaccine: Up to date  Pneumococcal Vaccine: Up to date  Screening Tests Health Maintenance  Topic Date Due  . INFLUENZA VACCINE  05/10/2019  . DEXA SCAN  06/14/2022  . TETANUS/TDAP  08/26/2024  . PNA vac Low Risk Adult  Completed    Cancer Screenings:  Colorectal Screening: No longer required.   Mammogram: No longer required.   Bone Density: Completed 06/14/17. Results reflect OSTEOPENIA. Repeat every 5 years.   Lung Cancer Screening: (Low Dose CT Chest recommended if Age 49-80 years, 30 pack-year currently smoking OR have quit w/in 15years.) does not qualify.   Additional Screening:  Vision Screening: Recommended annual ophthalmology exams for early detection of glaucoma and other disorders of the eye.  Dental Screening: Recommended annual dental exams for proper oral hygiene  Community Resource Referral:  CRR required this visit?  No  Plan:  I have personally reviewed and addressed the Medicare Annual Wellness questionnaire and have noted the following in the patient's chart:  A. Medical and social history B.  Use of alcohol, tobacco or illicit drugs  C. Current medications and supplements D. Functional ability and status E.  Nutritional status F.  Physical activity G. Advance directives H. List of other physicians I.  Hospitalizations, surgeries, and ER visits in previous 12 months J.  [place] such as hearing and vision if needed, cognitive and depression L. Referrals and appointments   In addition, I have reviewed and discussed with patient certain preventive protocols, quality metrics, and best practice recommendations. A written personalized care plan for preventive services as well as general preventive health recommendations were provided to patient. Nurse Health Advisor  Signed,    Mahum Betten [place], [place]  05/14/8256 Nurse Health Advisor   Nurse Notes: None.

## 2019-04-10 NOTE — Patient Instructions (Signed)
Carla Sanchez , Thank you for taking time to come for your Medicare Wellness Visit. I appreciate your ongoing commitment to your health goals. Please review the following plan we discussed and let me know if I can assist you in the future.   Screening recommendations/referrals: Colonoscopy: No longer required.  Mammogram: No longer required.  Bone Density: Up to date, due 06/2022 Recommended yearly ophthalmology/optometry visit for glaucoma screening and checkup Recommended yearly dental visit for hygiene and checkup  Vaccinations: Influenza vaccine: Up to date Pneumococcal vaccine: Completed series Tdap vaccine: Up to date, due 08/2024 Shingles vaccine: Pt declines today.     Advanced directives: Currently on file.   Conditions/risks identified: Continue to increase water intake to 6-8 8 oz glasses a day.   Next appointment: 06/11/19 @ 10 AM with Fenton Malling. Declined scheduling an AWV for 2021 at this time.    Preventive Care 55 Years and Older, Female Preventive care refers to lifestyle choices and visits with your health care provider that can promote health and wellness. What does preventive care include?  A yearly physical exam. This is also called an annual well check.  Dental exams once or twice a year.  Routine eye exams. Ask your health care provider how often you should have your eyes checked.  Personal lifestyle choices, including:  Daily care of your teeth and gums.  Regular physical activity.  Eating a healthy diet.  Avoiding tobacco and drug use.  Limiting alcohol use.  Practicing safe sex.  Taking low-dose aspirin every day.  Taking vitamin and mineral supplements as recommended by your health care provider. What happens during an annual well check? The services and screenings done by your health care provider during your annual well check will depend on your age, overall health, lifestyle risk factors, and family history of disease. Counseling   Your health care provider may ask you questions about your:  Alcohol use.  Tobacco use.  Drug use.  Emotional well-being.  Home and relationship well-being.  Sexual activity.  Eating habits.  History of falls.  Memory and ability to understand (cognition).  Work and work Statistician.  Reproductive health. Screening  You may have the following tests or measurements:  Height, weight, and BMI.  Blood pressure.  Lipid and cholesterol levels. These may be checked every 5 years, or more frequently if you are over 4 years old.  Skin check.  Lung cancer screening. You may have this screening every year starting at age 63 if you have a 30-pack-year history of smoking and currently smoke or have quit within the past 15 years.  Fecal occult blood test (FOBT) of the stool. You may have this test every year starting at age 52.  Flexible sigmoidoscopy or colonoscopy. You may have a sigmoidoscopy every 5 years or a colonoscopy every 10 years starting at age 41.  Hepatitis C blood test.  Hepatitis B blood test.  Sexually transmitted disease (STD) testing.  Diabetes screening. This is done by checking your blood sugar (glucose) after you have not eaten for a while (fasting). You may have this done every 1-3 years.  Bone density scan. This is done to screen for osteoporosis. You may have this done starting at age 82.  Mammogram. This may be done every 1-2 years. Talk to your health care provider about how often you should have regular mammograms. Talk with your health care provider about your test results, treatment options, and if necessary, the need for more tests. Vaccines  Your health  care provider may recommend certain vaccines, such as:  Influenza vaccine. This is recommended every year.  Tetanus, diphtheria, and acellular pertussis (Tdap, Td) vaccine. You may need a Td booster every 10 years.  Zoster vaccine. You may need this after age 23.  Pneumococcal 13-valent  conjugate (PCV13) vaccine. One dose is recommended after age 79.  Pneumococcal polysaccharide (PPSV23) vaccine. One dose is recommended after age 70. Talk to your health care provider about which screenings and vaccines you need and how often you need them. This information is not intended to replace advice given to you by your health care provider. Make sure you discuss any questions you have with your health care provider. Document Released: 10/22/2015 Document Revised: 06/14/2016 Document Reviewed: 07/27/2015 Elsevier Interactive Patient Education  2017 [place] Prevention in the Home Falls can cause injuries. They can happen to people of all ages. There are many things you can do to make your home safe and to help prevent falls. What can I do on the outside of my home?  Regularly fix the edges of walkways and driveways and fix any cracks.  Remove anything that might make you trip as you walk through a door, such as a raised step or threshold.  Trim any bushes or trees on the path to your home.  Use bright outdoor lighting.  Clear any walking paths of anything that might make someone trip, such as rocks or tools.  Regularly check to see if handrails are loose or broken. Make sure that both sides of any steps have handrails.  Any raised decks and porches should have guardrails on the edges.  Have any leaves, snow, or ice cleared regularly.  Use sand or salt on walking paths during winter.  Clean up any spills in your garage right away. This includes oil or grease spills. What can I do in the bathroom?  Use night lights.  Install grab bars by the toilet and in the tub and shower. Do not use towel bars as grab bars.  Use non-skid mats or decals in the tub or shower.  If you need to sit down in the shower, use a plastic, non-slip stool.  Keep the floor dry. Clean up any water that spills on the floor as soon as it happens.  Remove soap buildup in the tub or  shower regularly.  Attach bath mats securely with double-sided non-slip rug tape.  Do not have throw rugs and other things on the floor that can make you trip. What can I do in the bedroom?  Use night lights.  Make sure that you have a light by your bed that is easy to reach.  Do not use any sheets or blankets that are too big for your bed. They should not hang down onto the floor.  Have a firm chair that has side arms. You can use this for support while you get dressed.  Do not have throw rugs and other things on the floor that can make you trip. What can I do in the kitchen?  Clean up any spills right away.  Avoid walking on wet floors.  Keep items that you use a lot in easy-to-reach places.  If you need to reach something above you, use a strong step stool that has a grab bar.  Keep electrical cords out of the way.  Do not use floor polish or wax that makes floors slippery. If you must use wax, use non-skid floor wax.  Do not have  throw rugs and other things on the floor that can make you trip. What can I do with my stairs?  Do not leave any items on the stairs.  Make sure that there are handrails on both sides of the stairs and use them. Fix handrails that are broken or loose. Make sure that handrails are as long as the stairways.  Check any carpeting to make sure that it is firmly attached to the stairs. Fix any carpet that is loose or worn.  Avoid having throw rugs at the top or bottom of the stairs. If you do have throw rugs, attach them to the floor with carpet tape.  Make sure that you have a light switch at the top of the stairs and the bottom of the stairs. If you do not have them, ask someone to add them for you. What else can I do to help prevent falls?  Wear shoes that:  Do not have high heels.  Have rubber bottoms.  Are comfortable and fit you well.  Are closed at the toe. Do not wear sandals.  If you use a stepladder:  Make sure that it is fully  opened. Do not climb a closed stepladder.  Make sure that both sides of the stepladder are locked into place.  Ask someone to hold it for you, if possible.  Clearly mark and make sure that you can see:  Any grab bars or handrails.  First and last steps.  Where the edge of each step is.  Use tools that help you move around (mobility aids) if they are needed. These include:  Canes.  Walkers.  Scooters.  Crutches.  Turn on the lights when you go into a dark area. Replace any light bulbs as soon as they burn out.  Set up your furniture so you have a clear path. Avoid moving your furniture around.  If any of your floors are uneven, fix them.  If there are any pets around you, be aware of where they are.  Review your medicines with your doctor. Some medicines can make you feel dizzy. This can increase your chance of falling. Ask your doctor what other things that you can do to help prevent falls. This information is not intended to replace advice given to you by your health care provider. Make sure you discuss any questions you have with your health care provider. Document Released: 07/22/2009 Document Revised: 03/02/2016 Document Reviewed: 10/30/2014 Elsevier Interactive Patient Education  2017 [employer].

## 2019-04-10 NOTE — Addendum Note (Signed)
Addended by: Fabio Neighbors A on: 04/10/2019 03:21 PM   Modules accepted: Miquel Dunn

## 2019-04-30 ENCOUNTER — Telehealth: Payer: Self-pay | Admitting: Physician Assistant

## 2019-04-30 NOTE — Telephone Encounter (Signed)
Pt called saying she thinks she may need to see her cardiologist at [facility]  She can not remember his name.  She said she is not having any pain or acute issues.  She just says she has been feeling irregular heart beats lately.  She was diagnosed with this years ago.  CB#  [phone]  teri

## 2019-04-30 NOTE — Telephone Encounter (Signed)
So what is the question?  Does she need to see or who to go to?

## 2019-05-01 NOTE — Telephone Encounter (Signed)
I advised patient to call [facility] and see who she has seen for cardiology.  They should be able to tell her and schedule her an appt since that is what she is wanting to do.  I told her if she had any problems to feel free to call us back.  Con Memos

## 2019-05-07 DIAGNOSIS — R931 Abnormal findings on diagnostic imaging of heart and coronary circulation: Secondary | ICD-10-CM | POA: Diagnosis not present

## 2019-05-07 DIAGNOSIS — R002 Palpitations: Secondary | ICD-10-CM | POA: Diagnosis not present

## 2019-05-07 DIAGNOSIS — E7800 Pure hypercholesterolemia, unspecified: Secondary | ICD-10-CM | POA: Diagnosis not present

## 2019-05-07 DIAGNOSIS — R0789 Other chest pain: Secondary | ICD-10-CM | POA: Diagnosis not present

## 2019-05-07 DIAGNOSIS — R0602 Shortness of breath: Secondary | ICD-10-CM | POA: Diagnosis not present

## 2019-05-19 DIAGNOSIS — I493 Ventricular premature depolarization: Secondary | ICD-10-CM | POA: Diagnosis not present

## 2019-05-19 DIAGNOSIS — R0602 Shortness of breath: Secondary | ICD-10-CM | POA: Diagnosis not present

## 2019-05-19 DIAGNOSIS — R002 Palpitations: Secondary | ICD-10-CM | POA: Diagnosis not present

## 2019-05-19 DIAGNOSIS — I341 Nonrheumatic mitral (valve) prolapse: Secondary | ICD-10-CM | POA: Diagnosis not present

## 2019-06-10 NOTE — Progress Notes (Signed)
Patient: Carla Sanchez Female    DOB: [birth year]   83 y.o.   MRN: [id number] Visit Date: 06/11/2019  Today's Provider: [name], PA-C   Chief Complaint  Patient presents with  . Follow-up   Subjective:    I,Joseline E. Rosas,RMA am acting as a Education administrator for [employer], PA-C.  Patient had AWV with [facility] 04/10/2019.  HPI   Hypothyroidism due to fibrous invasive thyroiditis From 04/15/2018-labs checked, no changes. Not on supplementation.  Hypercholesterolemia From 04/15/2018-labs checked, no changes. Patient declines statins.   Chronic obstructive pulmonary disease, unspecified COPD type ([place]) From 04/15/2018-Stable. No treatment needed. No issues.  Malignant melanoma of skin of left lower extremity, including hip ([place]) From 04/15/2018-Stable. Recently had a squamous cell removed from left shoulder. Followed by Dr. Kellie Moor. Does skin checks at home and has f/u scheduled in February 2021 with Dr. Kellie Moor (pushed due to covid).  Patient wants to get her Influenza Vaccine together with her husband. Scheduled for 9/15  Allergies  Allergen Reactions  . Celebrex [Celecoxib]      Current Outpatient Medications:  .  ascorbic acid (VITAMIN C) 500 MG tablet, Take 500 mg by mouth daily., Disp: , Rfl:  .  aspirin 81 MG tablet, Take 81 mg by mouth daily., Disp: , Rfl:  .  Biotin 10 MG CAPS, Take by mouth daily. , Disp: , Rfl:  .  Calcium Carb-Cholecalciferol (CALCIUM CARBONATE-VITAMIN D3) 600-400 MG-UNIT TABS, Take 1 tablet by mouth daily., Disp: , Rfl:  .  co-enzyme Q-10 30 MG capsule, Take 30 mg by mouth daily. , Disp: , Rfl:  .  Multiple Vitamin (MULTIVITAMIN) tablet, Take 1 tablet by mouth daily., Disp: , Rfl:  .  Omega 3 1000 MG CAPS, Take 1 capsule by mouth daily. , Disp: , Rfl:  .  OXYQUINOLONE SULFATE VAGINAL (TRIMO-SAN) 0.025 % GEL, Place [id number] applicators vaginally once a week., Disp: 113.4 g, Rfl: 3 .  pyridoxine (B-6) 100 MG tablet, Take 100 mg by  mouth daily., Disp: , Rfl:  .  vitamin B-12 (CYANOCOBALAMIN) 1000 MCG tablet, Take 1,000 mcg by mouth daily., Disp: , Rfl:   Review of Systems  Constitutional: Negative.   HENT: Negative.   Eyes: Negative.   Respiratory: Negative.   Cardiovascular: Negative.   Gastrointestinal: Negative.   Endocrine: Negative.   Genitourinary: Negative.   Musculoskeletal: Negative.   Skin: Negative.   Allergic/Immunologic: Negative.   Neurological: Negative.   Hematological: Negative.   Psychiatric/Behavioral: Negative.     Social History   Tobacco Use  . Smoking status: Never Smoker  . Smokeless tobacco: Never Used  Substance Use Topics  . Alcohol use: Yes    Comment: 1-2 drinks rare      Objective:   BP 114/70 (BP Location: Left Arm, Patient Position: Sitting, Cuff Size: Large)   Pulse 69   Temp (!) 97.5 F (36.4 C) (Other (Comment)) Comment (Src): forehead  Resp 16   Wt 144 lb 9.6 oz (65.6 kg)   SpO2 96%   BMI 25.61 kg/m  Vitals:   06/11/19 0957  BP: 114/70  Pulse: 69  Resp: 16  Temp: (!) 97.5 F (36.4 C)  TempSrc: Other (Comment)  SpO2: 96%  Weight: 144 lb 9.6 oz (65.6 kg)  Body mass index is 25.61 kg/m.   Physical Exam Vitals signs reviewed.  Constitutional:      General: She is not in acute distress.    Appearance: Normal appearance. She  is well-developed and normal weight. She is not ill-appearing or diaphoretic.  HENT:     Head: Normocephalic and atraumatic.     Right Ear: Tympanic membrane, ear canal and external ear normal.     Left Ear: Tympanic membrane, ear canal and external ear normal.     Nose: Nose normal.     Mouth/Throat:     Mouth: Mucous membranes are moist.     Pharynx: No oropharyngeal exudate.  Eyes:     General: No scleral icterus.       Right eye: No discharge.        Left eye: No discharge.     Extraocular Movements: Extraocular movements intact.     Conjunctiva/sclera: Conjunctivae normal.     Pupils: Pupils are equal, round, and  reactive to light.  Neck:     Musculoskeletal: Normal range of motion and neck supple.     Thyroid: No thyromegaly.     Vascular: No carotid bruit or JVD.     Trachea: No tracheal deviation.  Cardiovascular:     Rate and Rhythm: Normal rate and regular rhythm.     Pulses: Normal pulses.     Heart sounds: Normal heart sounds. No murmur. No friction rub. No gallop.   Pulmonary:     Effort: Pulmonary effort is normal. No respiratory distress.     Breath sounds: Normal breath sounds. No wheezing or rales.  Chest:     Chest wall: No tenderness.  Abdominal:     General: Abdomen is flat. Bowel sounds are normal. There is no distension.     Palpations: Abdomen is soft. There is no mass.     Tenderness: There is no abdominal tenderness. There is no guarding or rebound.  Musculoskeletal: Normal range of motion.        General: No tenderness.  Lymphadenopathy:     Cervical: No cervical adenopathy.  Skin:    General: Skin is warm and dry.     Capillary Refill: Capillary refill takes less than 2 seconds.     Findings: No rash.  Neurological:     General: No focal deficit present.     Mental Status: She is alert and oriented to person, place, and time. Mental status is at baseline.  Psychiatric:        Mood and Affect: Mood normal.        Behavior: Behavior normal.        Thought Content: Thought content normal.        Judgment: Judgment normal.      No results found for any visits on 06/11/19.     Assessment & Plan    1. Chronic obstructive pulmonary disease, unspecified COPD type ([place]) Stable. Will check labs as below and f/u pending results. - CBC with Differential/Platelet - Comprehensive metabolic panel - Lipid panel - TSH  2. 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 No symptoms. No treatment. Will check labs as below and f/u pending results.' - CBC with Differential/Platelet - Comprehensive metabolic panel - Lipid panel - TSH  3. History of  malignant melanoma of skin Followed by Dermatology, Dr. Kellie Moor. Has had squamous cell, basal cells and melenoma lesions removed. Does wear sunscreen and stay covered if out in the sun.  - CBC with Differential/Platelet - Comprehensive metabolic panel - Lipid panel - TSH  4. Hypercholesterolemia Diet controlled. Patient has declined statins in the past. Will check labs as below and f/u pending results. - CBC with Differential/Platelet - Comprehensive  metabolic panel - Lipid panel - TSH  5. H/O breast augmentation Declines mammograms. Reports the left implant is leaking and she can feel wrinkles in the implant, but due to her age she does not desire repair. She does perform self breast exams and will call if she notices any changes.      [name], PA-C  [facility]

## 2019-06-11 ENCOUNTER — Other Ambulatory Visit: Payer: Self-pay

## 2019-06-11 ENCOUNTER — Encounter: Payer: Self-pay | Admitting: Physician Assistant

## 2019-06-11 ENCOUNTER — Ambulatory Visit (INDEPENDENT_AMBULATORY_CARE_PROVIDER_SITE_OTHER): Payer: Medicare Other | Admitting: Physician Assistant

## 2019-06-11 VITALS — BP 114/70 | HR 69 | Temp 97.5°F | Resp 16 | Wt 144.6 lb

## 2019-06-11 DIAGNOSIS — E065 Other chronic thyroiditis: Secondary | ICD-10-CM

## 2019-06-11 DIAGNOSIS — E7800 Pure hypercholesterolemia, unspecified: Secondary | ICD-10-CM | POA: Diagnosis not present

## 2019-06-11 DIAGNOSIS — Z85820 Personal history of malignant melanoma of skin: Secondary | ICD-10-CM | POA: Diagnosis not present

## 2019-06-11 DIAGNOSIS — J449 Chronic obstructive pulmonary disease, unspecified: Secondary | ICD-10-CM | POA: Diagnosis not present

## 2019-06-11 DIAGNOSIS — E038 Other specified hypothyroidism: Secondary | ICD-10-CM

## 2019-06-11 DIAGNOSIS — Z9882 Breast implant status: Secondary | ICD-10-CM | POA: Diagnosis not present

## 2019-06-11 NOTE — Patient Instructions (Signed)
Health Maintenance After Age 81 After age 81, you are at a higher risk for certain long-term diseases and infections as well as injuries from falls. Falls are a major cause of broken bones and head injuries in people who are older than age 81. Getting regular preventive care can help to keep you healthy and well. Preventive care includes getting regular testing and making lifestyle changes as recommended by your health care provider. Talk with your health care provider about:  Which screenings and tests you should have. A screening is a test that checks for a disease when you have no symptoms.  A diet and exercise plan that is right for you. What should I know about screenings and tests to prevent falls? Screening and testing are the best ways to find a health problem early. Early diagnosis and treatment give you the best chance of managing medical conditions that are common after age 81. Certain conditions and lifestyle choices may make you more likely to have a fall. Your health care provider may recommend:  Regular vision checks. Poor vision and conditions such as cataracts can make you more likely to have a fall. If you wear glasses, make sure to get your prescription updated if your vision changes.  Medicine review. Work with your health care provider to regularly review all of the medicines you are taking, including over-the-counter medicines. Ask your health care provider about any side effects that may make you more likely to have a fall. Tell your health care provider if any medicines that you take make you feel dizzy or sleepy.  Osteoporosis screening. Osteoporosis is a condition that causes the bones to get weaker. This can make the bones weak and cause them to break more easily.  Blood pressure screening. Blood pressure changes and medicines to control blood pressure can make you feel dizzy.  Strength and balance checks. Your health care provider may recommend certain tests to check your  strength and balance while standing, walking, or changing positions.  Foot health exam. Foot pain and numbness, as well as not wearing proper footwear, can make you more likely to have a fall.  Depression screening. You may be more likely to have a fall if you have a fear of falling, feel emotionally low, or feel unable to do activities that you used to do.  Alcohol use screening. Using too much alcohol can affect your balance and may make you more likely to have a fall. What actions can I take to lower my risk of falls? General instructions  Talk with your health care provider about your risks for falling. Tell your health care provider if: ? You fall. Be sure to tell your health care provider about all falls, even ones that seem minor. ? You feel dizzy, sleepy, or off-balance.  Take over-the-counter and prescription medicines only as told by your health care provider. These include any supplements.  Eat a healthy diet and maintain a healthy weight. A healthy diet includes low-fat dairy products, low-fat (lean) meats, and fiber from whole grains, beans, and lots of fruits and vegetables. Home safety  Remove any tripping hazards, such as rugs, cords, and clutter.  Install safety equipment such as grab bars in bathrooms and safety rails on stairs.  Keep rooms and walkways well-lit. Activity   Follow a regular exercise program to stay fit. This will help you maintain your balance. Ask your health care provider what types of exercise are appropriate for you.  If you need a cane or   walker, use it as recommended by your health care provider.  Wear supportive shoes that have nonskid soles. Lifestyle  Do not drink alcohol if your health care provider tells you not to drink.  If you drink alcohol, limit how much you have: ? 0-1 drink a day for women. ? 0-2 drinks a day for men.  Be aware of how much alcohol is in your drink. In the [place] one drink equals one typical bottle of beer (12  oz), one-half glass of wine (5 oz), or one shot of hard liquor (1 oz).  Do not use any products that contain nicotine or tobacco, such as cigarettes and e-cigarettes. If you need help quitting, ask your health care provider. Summary  Having a healthy lifestyle and getting preventive care can help to protect your health and wellness after age 81.  Screening and testing are the best way to find a health problem early and help you avoid having a fall. Early diagnosis and treatment give you the best chance for managing medical conditions that are more common for people who are older than age 81.  Falls are a major cause of broken bones and head injuries in people who are older than age 81. Take precautions to prevent a fall at home.  Work with your health care provider to learn what changes you can make to improve your health and wellness and to prevent falls. This information is not intended to replace advice given to you by your health care provider. Make sure you discuss any questions you have with your health care provider. Document Released: 08/08/2017 Document Revised: 01/16/2019 Document Reviewed: 08/08/2017 Elsevier Patient Education  2020 [employer]  

## 2019-06-12 LAB — COMPREHENSIVE METABOLIC PANEL
ALT: 16 IU/L (ref 0–32)
AST: 23 IU/L (ref 0–40)
Albumin/Globulin Ratio: 2.2 (ref 1.2–2.2)
Albumin: 4.4 g/dL (ref 3.6–4.6)
Alkaline Phosphatase: 78 IU/L (ref 39–117)
BUN/Creatinine Ratio: 12 (ref 12–28)
BUN: 10 mg/dL (ref 8–27)
Bilirubin Total: 0.4 mg/dL (ref 0.0–1.2)
CO2: 27 mmol/L (ref 20–29)
Calcium: 9.9 mg/dL (ref 8.7–10.3)
Chloride: 99 mmol/L (ref 96–106)
Creatinine, Ser: 0.81 mg/dL (ref 0.57–1.00)
GFR calc Af Amer: 79 mL/min/{1.73_m2} (ref >59)
GFR calc non Af Amer: 68 mL/min/{1.73_m2} (ref >59)
Globulin, Total: 2 g/dL (ref 1.5–4.5)
Glucose: 86 mg/dL (ref 65–99)
Potassium: 5.1 mmol/L (ref 3.5–5.2)
Sodium: 137 mmol/L (ref 134–144)
Total Protein: 6.4 g/dL (ref 6.0–8.5)

## 2019-06-12 LAB — CBC WITH DIFFERENTIAL/PLATELET
Basophils Absolute: 0.1 10*3/uL (ref 0.0–0.2)
Basos: 1 %
EOS (ABSOLUTE): 0.1 10*3/uL (ref 0.0–0.4)
Eos: 2 %
Hematocrit: 44.2 % (ref 34.0–46.6)
Hemoglobin: 14.2 g/dL (ref 11.1–15.9)
Immature Grans (Abs): 0 10*3/uL (ref 0.0–0.1)
Immature Granulocytes: 0 %
Lymphocytes Absolute: 2.1 10*3/uL (ref 0.7–3.1)
Lymphs: 27 %
MCH: 28.7 pg (ref 26.6–33.0)
MCHC: 32.1 g/dL (ref 31.5–35.7)
MCV: 90 fL (ref 79–97)
Monocytes Absolute: 0.7 10*3/uL (ref 0.1–0.9)
Monocytes: 8 %
Neutrophils Absolute: 5.1 10*3/uL (ref 1.4–7.0)
Neutrophils: 62 %
Platelets: 274 10*3/uL (ref 150–450)
RBC: 4.94 x10E6/uL (ref 3.77–5.28)
RDW: 13.3 % (ref 11.7–15.4)
WBC: 8.1 10*3/uL (ref 3.4–10.8)

## 2019-06-12 LAB — LIPID PANEL
Chol/HDL Ratio: 3.2 ratio (ref 0.0–4.4)
Cholesterol, Total: 230 mg/dL — ABNORMAL HIGH (ref 100–199)
HDL: 73 mg/dL (ref >39)
LDL Chol Calc (NIH): 140 mg/dL — ABNORMAL HIGH (ref 0–99)
Triglycerides: 95 mg/dL (ref 0–149)
VLDL Cholesterol Cal: 17 mg/dL (ref 5–40)

## 2019-06-12 LAB — TSH: TSH: 4.37 u[IU]/mL (ref 0.450–4.500)

## 2019-06-19 ENCOUNTER — Encounter: Payer: Self-pay | Admitting: Obstetrics and Gynecology

## 2019-06-19 ENCOUNTER — Telehealth: Payer: Self-pay | Admitting: Obstetrics and Gynecology

## 2019-06-19 ENCOUNTER — Other Ambulatory Visit: Payer: Self-pay

## 2019-06-19 ENCOUNTER — Ambulatory Visit (INDEPENDENT_AMBULATORY_CARE_PROVIDER_SITE_OTHER): Payer: Medicare Other | Admitting: Obstetrics and Gynecology

## 2019-06-19 VITALS — BP 117/77 | HR 71 | Ht 63.0 in | Wt 143.3 lb

## 2019-06-19 DIAGNOSIS — Z4689 Encounter for fitting and adjustment of other specified devices: Secondary | ICD-10-CM | POA: Diagnosis not present

## 2019-06-19 MED ORDER — TRIMO-SAN 0.025 % VA GEL: 0.2500 | VAGINAL | 3 refills | Status: DC

## 2019-06-19 NOTE — Progress Notes (Signed)
    GYNECOLOGY PROGRESS NOTE  Subjective:    Patient ID: Carla Sanchez, female    DOB: 1935-12-07, 83 y.o.   MRN: [id number]  HPI  Patient is a 83 y.o. G75P4 female who presents for a 6 month pessary check. She reports no vaginal bleeding or discharge. She denies pelvic discomfort and difficulty urinating or moving her bowels. She has no issues with self-maintenance of the pessary at home, noting that she cleans it ~ 2-3 time weekly if she notes an odor. Uses Trimo-san gel once weekly.   The following portions of the patient's history were reviewed and updated as appropriate: allergies, current medications, past family history, past medical history, past social history, past surgical history and problem list.   Review of Systems Pertinent items noted in HPI and remainder of comprehensive ROS otherwise negative.   Objective:   Blood pressure 117/77, pulse 71, height 5\' 3"  (1.6 m), weight 143 lb 4.8 oz (65 kg). General appearance: alert and no distress Abdomen: soft, non-tender; bowel sounds normal; no masses,  no organomegaly Pelvic: The patient's Size (#5 ring with diaphragm support) pessary was removed, cleaned and replaced without complications. Speculum examination revealed normal vaginal mucosa with no lesions or lacerations. Moderate vaginal atrophy.    Assessment:   Pessary maintenance Cystocele (second to third degree) Rectocele (first to second degree) Vaginal atrophy  Plan:   - The patient should return in 6 months for a pessary check and continue to use Trimosan gel weekly as prescribed.  Continued to encourage cleaning as needed.  Refill given on Trimo-san gel.  - Moderate vaginal atrophy, asymptomatic.   Rubie Maid, MD [facility]

## 2019-06-19 NOTE — Telephone Encounter (Signed)
Done

## 2019-06-19 NOTE — Telephone Encounter (Signed)
The patient stated before leaving the office if her prescription will be refilled the patient stated that she will need more medication. Please advise.

## 2019-06-19 NOTE — Progress Notes (Signed)
Pt present for pessary check. Pt stated she changes and cleans her pessary twice a week if she smells a odor. Pt stated that she was not having problems with her pessary at this time.

## 2019-06-24 ENCOUNTER — Ambulatory Visit (INDEPENDENT_AMBULATORY_CARE_PROVIDER_SITE_OTHER): Payer: Medicare Other

## 2019-06-24 DIAGNOSIS — Z23 Encounter for immunization: Secondary | ICD-10-CM | POA: Diagnosis not present

## 2019-11-19 DIAGNOSIS — I341 Nonrheumatic mitral (valve) prolapse: Secondary | ICD-10-CM | POA: Diagnosis not present

## 2019-11-19 DIAGNOSIS — E7800 Pure hypercholesterolemia, unspecified: Secondary | ICD-10-CM | POA: Diagnosis not present

## 2019-11-19 DIAGNOSIS — I2510 Atherosclerotic heart disease of native coronary artery without angina pectoris: Secondary | ICD-10-CM | POA: Diagnosis not present

## 2019-11-19 DIAGNOSIS — I493 Ventricular premature depolarization: Secondary | ICD-10-CM | POA: Diagnosis not present

## 2019-11-19 DIAGNOSIS — R002 Palpitations: Secondary | ICD-10-CM | POA: Diagnosis not present

## 2019-11-20 DIAGNOSIS — Z85820 Personal history of malignant melanoma of skin: Secondary | ICD-10-CM | POA: Diagnosis not present

## 2019-11-20 DIAGNOSIS — D2262 Melanocytic nevi of left upper limb, including shoulder: Secondary | ICD-10-CM | POA: Diagnosis not present

## 2019-11-20 DIAGNOSIS — Z85828 Personal history of other malignant neoplasm of skin: Secondary | ICD-10-CM | POA: Diagnosis not present

## 2019-11-20 DIAGNOSIS — D2261 Melanocytic nevi of right upper limb, including shoulder: Secondary | ICD-10-CM | POA: Diagnosis not present

## 2019-11-20 DIAGNOSIS — L821 Other seborrheic keratosis: Secondary | ICD-10-CM | POA: Diagnosis not present

## 2019-11-20 DIAGNOSIS — D2271 Melanocytic nevi of right lower limb, including hip: Secondary | ICD-10-CM | POA: Diagnosis not present

## 2019-12-16 DIAGNOSIS — H52223 Regular astigmatism, bilateral: Secondary | ICD-10-CM | POA: Diagnosis not present

## 2019-12-16 DIAGNOSIS — Z9842 Cataract extraction status, left eye: Secondary | ICD-10-CM | POA: Diagnosis not present

## 2019-12-16 DIAGNOSIS — Z9841 Cataract extraction status, right eye: Secondary | ICD-10-CM | POA: Diagnosis not present

## 2019-12-17 ENCOUNTER — Other Ambulatory Visit: Payer: Self-pay

## 2019-12-17 ENCOUNTER — Encounter: Payer: Self-pay | Admitting: Obstetrics and Gynecology

## 2019-12-17 ENCOUNTER — Ambulatory Visit (INDEPENDENT_AMBULATORY_CARE_PROVIDER_SITE_OTHER): Payer: Medicare Other | Admitting: Obstetrics and Gynecology

## 2019-12-17 VITALS — BP 102/60 | HR 73 | Ht 63.0 in | Wt 145.2 lb

## 2019-12-17 DIAGNOSIS — N816 Rectocele: Secondary | ICD-10-CM | POA: Diagnosis not present

## 2019-12-17 DIAGNOSIS — N952 Postmenopausal atrophic vaginitis: Secondary | ICD-10-CM

## 2019-12-17 DIAGNOSIS — Z4689 Encounter for fitting and adjustment of other specified devices: Secondary | ICD-10-CM

## 2019-12-17 DIAGNOSIS — N8111 Cystocele, midline: Secondary | ICD-10-CM

## 2019-12-17 NOTE — Progress Notes (Signed)
    GYNECOLOGY PROGRESS NOTE  Subjective:    Patient ID: Carla Sanchez, female    DOB: [birth year], 84 y.o.   MRN: [id number]  HPI  Patient is a 84 y.o. G17P4 female who presents for a 6 month pessary check. She denies complaints today. She reports no vaginal bleeding or discharge. She denies pelvic discomfort and difficulty urinating or moving her bowels. She has no issues with self-maintenance of the pessary at home, noting that she cleans it approximately twice weekly. Uses Trimo-san gel once weekly.   The following portions of the patient's history were reviewed and updated as appropriate: allergies, current medications, past family history, past medical history, past social history, past surgical history and problem list.   Review of Systems Pertinent items noted in HPI and remainder of comprehensive ROS otherwise negative.   Objective:   Blood pressure 102/60, pulse 73, height 5\' 3"  (1.6 m), weight 145 lb 3.2 oz (65.9 kg). General appearance: alert and no distress Abdomen: soft, non-tender; bowel sounds normal; no masses,  no organomegaly Pelvic: The patient's Size (#5 ring with diaphragm support) pessary was removed, cleaned and replaced without complications). Speculum examination revealed normal vaginal mucosa with no lesions or lacerations. Mild to moderate vaginal atrophy.    Assessment:   Pessary maintenance Cystocele (second to third degree) Rectocele (first to second degree) Vaginal atrophy  Plan:   - The patient should return in 6 months for a pessary check and continue to use Trimosan gel weekly as prescribed.  Continued to encourage cleaning as needed.   - Moderate vaginal atrophy, asymptomatic.    Rubie Maid, MD [facility]

## 2019-12-17 NOTE — Progress Notes (Signed)
Pt present for pessary check. Pt stated that she was doing well no problems.  

## 2020-04-21 ENCOUNTER — Encounter: Payer: Self-pay | Admitting: Physician Assistant

## 2020-04-28 NOTE — Progress Notes (Signed)
Subjective:   Carla Sanchez is a 84 y.o. female who presents for Medicare Annual (Subsequent) preventive examination.  I connected with Carla Sanchez today by telephone and verified that I am speaking with the correct person using two identifiers. Location patient: home Location provider: work Persons participating in the virtual visit: patient, provider.   I discussed the limitations, risks, security and privacy concerns of performing an evaluation and management service by telephone and the availability of in person appointments. I also discussed with the patient that there may be a patient responsible charge related to this service. The patient expressed understanding and verbally consented to this telephonic visit.    Interactive audio and video telecommunications were attempted between this provider and patient, however failed, due to patient having technical difficulties OR patient did not have access to video capability.  We continued and completed visit with audio only.    Review of Systems    N/A  Cardiac Risk Factors include: advanced age (>69men, >86 women)     Objective:    There were no vitals filed for this visit. There is no height or weight on file to calculate BMI.  Advanced Directives 04/29/2020 04/10/2019 04/08/2018 04/06/2017 03/20/2016 10/13/2015  Does Patient Have a Medical Advance Directive? Yes Yes Yes Yes Yes Yes  Type of Advance Directive Living will [place];Living will [place];Living will Living will;Healthcare Power of [place];Living will Living will;Healthcare Power of [place] in Chart? - Yes - validated most recent copy scanned in chart (See row information) No - copy requested No - copy requested - -    Current Medications (verified) Outpatient Encounter Medications as of 04/29/2020  Medication Sig  . ascorbic acid (VITAMIN C) 500 MG  tablet Take 500 mg by mouth daily.  Marland Kitchen aspirin 81 MG tablet Take 81 mg by mouth daily.  . Biotin 10 MG CAPS Take by mouth daily.   . Calcium Carb-Cholecalciferol (CALCIUM CARBONATE-VITAMIN D3) 600-400 MG-UNIT TABS Take 1 tablet by mouth daily.  Marland Kitchen co-enzyme Q-10 30 MG capsule Take 30 mg by mouth daily.   . Multiple Vitamin (MULTIVITAMIN) tablet Take 1 tablet by mouth daily.  . Omega 3 1000 MG CAPS Take 1 capsule by mouth daily.   Levin Erp SULFATE VAGINAL (TRIMO-SAN) 0.025 % GEL Place 4.09 applicators vaginally once a week.  . pyridoxine (B-6) 100 MG tablet Take 100 mg by mouth daily.  . vitamin B-12 (CYANOCOBALAMIN) 1000 MCG tablet Take 1,000 mcg by mouth daily.   No facility-administered encounter medications on file as of 04/29/2020.    Allergies (verified) Celebrex [celecoxib]   History: Past Medical History:  Diagnosis Date  . Arthritis   . BCC (basal cell carcinoma) 04/18/2018  . Cancer ([place])    Malignant Melanoma  . Endometriosis   . Hemorrhoids   . Migraines   . SCCA (squamous cell carcinoma) of skin 04/04/2018   removed from left shoulder   Past Surgical History:  Procedure Laterality Date  . ABDOMINAL HYSTERECTOMY     in her 50's  . bladder sugery  2008   sling-  dr Elenor Quinones  . Bladder Tack    . BREAST SURGERY     Bilateral Mastectomy  . COLONOSCOPY WITH PROPOFOL N/A 03/29/2015   Procedure: COLONOSCOPY WITH PROPOFOL;  Surgeon: Lollie Sails, MD;  Location: [facility] ENDOSCOPY;  Service: Endoscopy;  Laterality: N/A;  . HAND SURGERY Right 04/2015  . HEMORRHOIDECTOMY  WITH HEMORRHOID BANDING    . JOINT REPLACEMENT     RT TKR; LT TKR; RT Total Hip Arthroplasty; LT Hip Arthroplasty  . LUNG SURGERY     at age 5, also removed a rib   Family History  Problem Relation Age of Onset  . Heart failure Mother   . Arthritis Mother   . Hypertension Father   . Heart attack Father   . Seizures Brother   . Seizures Sister   . Kidney disease Sister   . Cancer Sister         lymphoma  . Cancer Sister   . Breast cancer Sister   . Cancer Brother        prostate  . Heart disease Brother 72       heart attack  . Arthritis Other   . Heart attack Other   . Epilepsy Other   . Thyroid cancer Other   . Myasthenia gravis Other   . Prostate cancer Other   . Cancer Niece        breast cancer   Social History   Socioeconomic History  . Marital status: Married    Spouse name: Not on file  . Number of children: 4  . Years of education: Not on file  . Highest education level: 12th grade  Occupational History  . Not on file  Tobacco Use  . Smoking status: Never Smoker  . Smokeless tobacco: Never Used  Vaping Use  . Vaping Use: Never used  Substance and Sexual Activity  . Alcohol use: Not Currently    Comment: 1-2 drinks rare  . Drug use: No  . Sexual activity: Yes    Birth control/protection: Surgical  Other Topics Concern  . Not on file  Social History Narrative  . Not on file   Social Determinants of Health   Financial Resource Strain: Low Risk   . Difficulty of Paying Living Expenses: Not hard at all  Food Insecurity: No Food Insecurity  . Worried About Charity fundraiser in the Last Year: Never true  . Ran Out of Food in the Last Year: Never true  Transportation Needs: No Transportation Needs  . Lack of Transportation (Medical): No  . Lack of Transportation (Non-Medical): No  Physical Activity: Inactive  . Days of Exercise per Week: 0 days  . Minutes of Exercise per Session: 0 min  Stress: No Stress Concern Present  . Feeling of Stress : Not at all  Social Connections: Socially Integrated  . Frequency of Communication with Friends and Family: Sanchez than three times a week  . Frequency of Social Gatherings with Friends and Family: Sanchez than three times a week  . Attends Religious Services: Sanchez than 4 times per year  . Active Member of Clubs or Organizations: Yes  . Attends Archivist Meetings: Sanchez than 4 times per year  .  Marital Status: Married    Tobacco Counseling Counseling given: Not Answered   Clinical Intake:  Pre-visit preparation completed: Yes  Pain : No/denies pain     Nutritional Risks: None Diabetes: No  How often do you need to have someone help you when you read instructions, pamphlets, or other written materials from your doctor or pharmacy?: 1 - Never  Diabetic? No  Interpreter Needed?: No  Information entered by :: [facility], LPN   Activities of Daily Living In your present state of health, do you have any difficulty performing the following activities: 04/29/2020  Hearing? Y  Comment  Has hearing loss in the left ear. Wears a hearing aid in that ear.  Vision? N  Difficulty concentrating or making decisions? N  Walking or climbing stairs? N  Dressing or bathing? N  Doing errands, shopping? N  Preparing Food and eating ? N  Using the Toilet? N  In the past six months, have you accidently leaked urine? Y  Comment Has a bladder sling. Wears protection daily.  Do you have problems with loss of bowel control? N  Managing your Medications? N  Managing your Finances? N  Housekeeping or managing your Housekeeping? N  Some recent data might be hidden    Patient Care Team: [name], PA-C as PCP - General (Family Medicine) Thelma Comp, OD as Consulting Physician (Optometry) Oneta Rack, MD as Consulting Physician (Dermatology) Rubie Maid, MD as Referring Physician (Obstetrics and Gynecology) Isaias Cowman, MD as Consulting Physician (Cardiology)  Indicate any recent Medical Services you may have received from other than [facility] providers in the past year (date may be approximate).     Assessment:   This is a routine wellness examination for [place].  Hearing/Vision screen No exam data present  Dietary issues and exercise activities discussed: Current Exercise Habits: The patient does not participate in regular exercise at present,  Exercise limited by: None identified  Goals    . Prevent falls     Recommend to remove any items from the home that may cause slips or trips.      Depression Screen PHQ 2/9 Scores 04/29/2020 04/10/2019 04/08/2018 04/06/2017 04/06/2017 03/20/2016  PHQ - 2 Score 0 0 0 0 0 0  PHQ- 9 Score - - - 1 - -    Fall Risk Fall Risk  04/29/2020 04/10/2019 04/08/2018 07/11/2017 04/06/2017  Falls in the past year? 1 0 Yes No No  Number falls in past yr: 0 - 1 - -  Injury with Fall? 0 - No - -  Follow up Falls prevention discussed - Falls prevention discussed - -    Any stairs in or around the home? Yes  If so, are there any without handrails? No  Home free of loose throw rugs in walkways, pet beds, electrical cords, etc? Yes  Adequate lighting in your home to reduce risk of falls? Yes   ASSISTIVE DEVICES UTILIZED TO PREVENT FALLS:  Life alert? No  Use of a cane, walker or w/c? No  Grab bars in the bathroom? Yes  Shower chair or bench in shower? No  Elevated toilet seat or a handicapped toilet? Yes   Cognitive Function: Declined today.     6CIT Screen 04/10/2019 04/06/2017  What Year? 0 points 0 points  What month? 0 points 0 points  What time? 0 points 0 points  Count back from 20 0 points 0 points  Months in reverse 0 points 0 points  Repeat phrase 4 points 2 points  Total Score 4 2    Immunizations Immunization History  Administered Date(s) Administered  . Fluad Quad(high Dose 65+) 06/24/2019  . Influenza, High Dose Seasonal PF 07/21/2015, 07/28/2016, 07/11/2017, 08/08/2018  . Pneumococcal Conjugate-13 08/26/2014  . Pneumococcal Polysaccharide-23 08/11/1999, 07/11/2017  . Td 05/04/1997  . Tdap 08/26/2014  . Zoster 02/12/2012    TDAP status: Up to date Flu Vaccine status: Up to date Pneumococcal vaccine status: Up to date Covid-19 vaccine status: Completed vaccines  Qualifies for Shingles Vaccine? Yes   Zostavax completed Yes   Shingrix Completed?: No.    Education has been provided  regarding the importance of this vaccine. Patient has been advised to call insurance company to determine out of pocket expense if they have not yet received this vaccine. Advised may also receive vaccine at local pharmacy or Health Dept. Verbalized acceptance and understanding.  Screening Tests Health Maintenance  Topic Date Due  . COVID-19 Vaccine (1) Never done  . INFLUENZA VACCINE  05/09/2020  . DEXA SCAN  06/14/2022  . TETANUS/TDAP  08/26/2024  . PNA vac Low Risk Adult  Completed    Health Maintenance  Health Maintenance Due  Topic Date Due  . COVID-19 Vaccine (1) Never done    Colorectal cancer screening: No longer required.  Mammogram status: No longer required.  Bone Density status: Completed 06/14/17. Results reflect: Bone density results: OSTEOPENIA. Repeat every 5 years.  Lung Cancer Screening: (Low Dose CT Chest recommended if Age 10-80 years, 30 pack-year currently smoking OR have quit w/in 15years.) does not qualify.   Additional Screening:  Vision Screening: Recommended annual ophthalmology exams for early detection of glaucoma and other disorders of the eye. Is the patient up to date with their annual eye exam? Yes Who is the provider or what is the name of the office in which the patient attends annual eye exams? Dr Rick Duff If pt is not established with a provider, would they like to be referred to a provider to establish care? No .   Dental Screening: Recommended annual dental exams for proper oral hygiene  Community Resource Referral / Chronic Care Management: CRR required this visit?  No   CCM required this visit?  No      Plan:     I have personally reviewed and noted the following in the patient's chart:   . Medical and social history . Use of alcohol, tobacco or illicit drugs  . Current medications and supplements . Functional ability and status . Nutritional status . Physical activity . Advanced directives . List of other  physicians . Hospitalizations, surgeries, and ER visits in previous 12 months . Vitals . Screenings to include cognitive, depression, and falls . Referrals and appointments  In addition, I have reviewed and discussed with patient certain preventive protocols, quality metrics, and best practice recommendations. A written personalized care plan for preventive services as well as general preventive health recommendations were provided to patient.     Amazin Pincock [place], [place]   [id number]   Nurse Notes: Requested patient to bring in COVID vaccine card to update chart.

## 2020-04-29 ENCOUNTER — Other Ambulatory Visit: Payer: Self-pay

## 2020-04-29 ENCOUNTER — Encounter: Payer: Self-pay | Admitting: Physician Assistant

## 2020-04-29 ENCOUNTER — Ambulatory Visit (INDEPENDENT_AMBULATORY_CARE_PROVIDER_SITE_OTHER): Payer: Medicare Other | Admitting: Physician Assistant

## 2020-04-29 ENCOUNTER — Ambulatory Visit (INDEPENDENT_AMBULATORY_CARE_PROVIDER_SITE_OTHER): Payer: Medicare Other

## 2020-04-29 VITALS — BP 130/79 | HR 60 | Temp 97.1°F | Resp 16 | Ht 63.0 in | Wt 141.6 lb

## 2020-04-29 DIAGNOSIS — E038 Other specified hypothyroidism: Secondary | ICD-10-CM | POA: Diagnosis not present

## 2020-04-29 DIAGNOSIS — Z0000 Encounter for general adult medical examination without abnormal findings: Secondary | ICD-10-CM | POA: Diagnosis not present

## 2020-04-29 DIAGNOSIS — E065 Other chronic thyroiditis: Secondary | ICD-10-CM

## 2020-04-29 DIAGNOSIS — J449 Chronic obstructive pulmonary disease, unspecified: Secondary | ICD-10-CM | POA: Diagnosis not present

## 2020-04-29 DIAGNOSIS — E7800 Pure hypercholesterolemia, unspecified: Secondary | ICD-10-CM

## 2020-04-29 NOTE — Patient Instructions (Signed)
Ms. Carla Sanchez , Thank you for taking time to come for your Medicare Wellness Visit. I appreciate your ongoing commitment to your health goals. Please review the following plan we discussed and let me know if I can assist you in the future.   Screening recommendations/referrals: Colonoscopy: No longer required.  Mammogram: No longer required.  Bone Density: Up to date, due 06/2022 Recommended yearly ophthalmology/optometry visit for glaucoma screening and checkup Recommended yearly dental visit for hygiene and checkup  Vaccinations: Influenza vaccine: Done 9/15/120 Pneumococcal vaccine: Completed series Tdap vaccine: Up to date, due 08/2024 Shingles vaccine: Shingrix discussed. Please contact your pharmacy for coverage information.     Advanced directives: Currently on file  Conditions/risks identified: Fall risk prevention discussed today.  Next appointment: 9:00 AM today with [place] 65 Years and Older, Female Preventive care refers to lifestyle choices and visits with your health care provider that can promote health and wellness. What does preventive care include?  A yearly physical exam. This is also called an annual well check.  Dental exams once or twice a year.  Routine eye exams. Ask your health care provider how often you should have your eyes checked.  Personal lifestyle choices, including:  Daily care of your teeth and gums.  Regular physical activity.  Eating a healthy diet.  Avoiding tobacco and drug use.  Limiting alcohol use.  Practicing safe sex.  Taking low-dose aspirin every day.  Taking vitamin and mineral supplements as recommended by your health care provider. What happens during an annual well check? The services and screenings done by your health care provider during your annual well check will depend on your age, overall health, lifestyle risk factors, and family history of disease. Counseling  Your health care  provider may ask you questions about your:  Alcohol use.  Tobacco use.  Drug use.  Emotional well-being.  Home and relationship well-being.  Sexual activity.  Eating habits.  History of falls.  Memory and ability to understand (cognition).  Work and work Statistician.  Reproductive health. Screening  You may have the following tests or measurements:  Height, weight, and BMI.  Blood pressure.  Lipid and cholesterol levels. These may be checked every 5 years, or more frequently if you are over 81 years old.  Skin check.  Lung cancer screening. You may have this screening every year starting at age 1 if you have a 30-pack-year history of smoking and currently smoke or have quit within the past 15 years.  Fecal occult blood test (FOBT) of the stool. You may have this test every year starting at age 7.  Flexible sigmoidoscopy or colonoscopy. You may have a sigmoidoscopy every 5 years or a colonoscopy every 10 years starting at age 27.  Hepatitis C blood test.  Hepatitis B blood test.  Sexually transmitted disease (STD) testing.  Diabetes screening. This is done by checking your blood sugar (glucose) after you have not eaten for a while (fasting). You may have this done every 1-3 years.  Bone density scan. This is done to screen for osteoporosis. You may have this done starting at age 62.  Mammogram. This may be done every 1-2 years. Talk to your health care provider about how often you should have regular mammograms. Talk with your health care provider about your test results, treatment options, and if necessary, the need for more tests. Vaccines  Your health care provider may recommend certain vaccines, such as:  Influenza vaccine. This is recommended  every year.  Tetanus, diphtheria, and acellular pertussis (Tdap, Td) vaccine. You may need a Td booster every 10 years.  Zoster vaccine. You may need this after age 19.  Pneumococcal 13-valent conjugate (PCV13)  vaccine. One dose is recommended after age 57.  Pneumococcal polysaccharide (PPSV23) vaccine. One dose is recommended after age 23. Talk to your health care provider about which screenings and vaccines you need and how often you need them. This information is not intended to replace advice given to you by your health care provider. Make sure you discuss any questions you have with your health care provider. Document Released: 10/22/2015 Document Revised: 06/14/2016 Document Reviewed: 07/27/2015 Elsevier Interactive Patient Education  2017 [place] Prevention in the Home Falls can cause injuries. They can happen to people of all ages. There are many things you can do to make your home safe and to help prevent falls. What can I do on the outside of my home?  Regularly fix the edges of walkways and driveways and fix any cracks.  Remove anything that might make you trip as you walk through a door, such as a raised step or threshold.  Trim any bushes or trees on the path to your home.  Use bright outdoor lighting.  Clear any walking paths of anything that might make someone trip, such as rocks or tools.  Regularly check to see if handrails are loose or broken. Make sure that both sides of any steps have handrails.  Any raised decks and porches should have guardrails on the edges.  Have any leaves, snow, or ice cleared regularly.  Use sand or salt on walking paths during winter.  Clean up any spills in your garage right away. This includes oil or grease spills. What can I do in the bathroom?  Use night lights.  Install grab bars by the toilet and in the tub and shower. Do not use towel bars as grab bars.  Use non-skid mats or decals in the tub or shower.  If you need to sit down in the shower, use a plastic, non-slip stool.  Keep the floor dry. Clean up any water that spills on the floor as soon as it happens.  Remove soap buildup in the tub or shower  regularly.  Attach bath mats securely with double-sided non-slip rug tape.  Do not have throw rugs and other things on the floor that can make you trip. What can I do in the bedroom?  Use night lights.  Make sure that you have a light by your bed that is easy to reach.  Do not use any sheets or blankets that are too big for your bed. They should not hang down onto the floor.  Have a firm chair that has side arms. You can use this for support while you get dressed.  Do not have throw rugs and other things on the floor that can make you trip. What can I do in the kitchen?  Clean up any spills right away.  Avoid walking on wet floors.  Keep items that you use a lot in easy-to-reach places.  If you need to reach something above you, use a strong step stool that has a grab bar.  Keep electrical cords out of the way.  Do not use floor polish or wax that makes floors slippery. If you must use wax, use non-skid floor wax.  Do not have throw rugs and other things on the floor that can make you trip. What  can I do with my stairs?  Do not leave any items on the stairs.  Make sure that there are handrails on both sides of the stairs and use them. Fix handrails that are broken or loose. Make sure that handrails are as long as the stairways.  Check any carpeting to make sure that it is firmly attached to the stairs. Fix any carpet that is loose or worn.  Avoid having throw rugs at the top or bottom of the stairs. If you do have throw rugs, attach them to the floor with carpet tape.  Make sure that you have a light switch at the top of the stairs and the bottom of the stairs. If you do not have them, ask someone to add them for you. What else can I do to help prevent falls?  Wear shoes that:  Do not have high heels.  Have rubber bottoms.  Are comfortable and fit you well.  Are closed at the toe. Do not wear sandals.  If you use a stepladder:  Make sure that it is fully  opened. Do not climb a closed stepladder.  Make sure that both sides of the stepladder are locked into place.  Ask someone to hold it for you, if possible.  Clearly mark and make sure that you can see:  Any grab bars or handrails.  First and last steps.  Where the edge of each step is.  Use tools that help you move around (mobility aids) if they are needed. These include:  Canes.  Walkers.  Scooters.  Crutches.  Turn on the lights when you go into a dark area. Replace any light bulbs as soon as they burn out.  Set up your furniture so you have a clear path. Avoid moving your furniture around.  If any of your floors are uneven, fix them.  If there are any pets around you, be aware of where they are.  Review your medicines with your doctor. Some medicines can make you feel dizzy. This can increase your chance of falling. Ask your doctor what other things that you can do to help prevent falls. This information is not intended to replace advice given to you by your health care provider. Make sure you discuss any questions you have with your health care provider. Document Released: 07/22/2009 Document Revised: 03/02/2016 Document Reviewed: 10/30/2014 Elsevier Interactive Patient Education  2017 [employer].

## 2020-04-29 NOTE — Patient Instructions (Signed)
Health Maintenance After Age 82 After age 82, you are at a higher risk for certain long-term diseases and infections as well as injuries from falls. Falls are a major cause of broken bones and head injuries in people who are older than age 82. Getting regular preventive care can help to keep you healthy and well. Preventive care includes getting regular testing and making lifestyle changes as recommended by your health care provider. Talk with your health care provider about:  Which screenings and tests you should have. A screening is a test that checks for a disease when you have no symptoms.  A diet and exercise plan that is right for you. What should I know about screenings and tests to prevent falls? Screening and testing are the best ways to find a health problem early. Early diagnosis and treatment give you the best chance of managing medical conditions that are common after age 82. Certain conditions and lifestyle choices may make you more likely to have a fall. Your health care provider may recommend:  Regular vision checks. Poor vision and conditions such as cataracts can make you more likely to have a fall. If you wear glasses, make sure to get your prescription updated if your vision changes.  Medicine review. Work with your health care provider to regularly review all of the medicines you are taking, including over-the-counter medicines. Ask your health care provider about any side effects that may make you more likely to have a fall. Tell your health care provider if any medicines that you take make you feel dizzy or sleepy.  Osteoporosis screening. Osteoporosis is a condition that causes the bones to get weaker. This can make the bones weak and cause them to break more easily.  Blood pressure screening. Blood pressure changes and medicines to control blood pressure can make you feel dizzy.  Strength and balance checks. Your health care provider may recommend certain tests to check your  strength and balance while standing, walking, or changing positions.  Foot health exam. Foot pain and numbness, as well as not wearing proper footwear, can make you more likely to have a fall.  Depression screening. You may be more likely to have a fall if you have a fear of falling, feel emotionally low, or feel unable to do activities that you used to do.  Alcohol use screening. Using too much alcohol can affect your balance and may make you more likely to have a fall. What actions can I take to lower my risk of falls? General instructions  Talk with your health care provider about your risks for falling. Tell your health care provider if: ? You fall. Be sure to tell your health care provider about all falls, even ones that seem minor. ? You feel dizzy, sleepy, or off-balance.  Take over-the-counter and prescription medicines only as told by your health care provider. These include any supplements.  Eat a healthy diet and maintain a healthy weight. A healthy diet includes low-fat dairy products, low-fat (lean) meats, and fiber from whole grains, beans, and lots of fruits and vegetables. Home safety  Remove any tripping hazards, such as rugs, cords, and clutter.  Install safety equipment such as grab bars in bathrooms and safety rails on stairs.  Keep rooms and walkways well-lit. Activity   Follow a regular exercise program to stay fit. This will help you maintain your balance. Ask your health care provider what types of exercise are appropriate for you.  If you need a cane or   walker, use it as recommended by your health care provider.  Wear supportive shoes that have nonskid soles. Lifestyle  Do not drink alcohol if your health care provider tells you not to drink.  If you drink alcohol, limit how much you have: ? 0-1 drink a day for women. ? 0-2 drinks a day for men.  Be aware of how much alcohol is in your drink. In the [place] one drink equals one typical bottle of beer (12  oz), one-half glass of wine (5 oz), or one shot of hard liquor (1 oz).  Do not use any products that contain nicotine or tobacco, such as cigarettes and e-cigarettes. If you need help quitting, ask your health care provider. Summary  Having a healthy lifestyle and getting preventive care can help to protect your health and wellness after age 82.  Screening and testing are the best way to find a health problem early and help you avoid having a fall. Early diagnosis and treatment give you the best chance for managing medical conditions that are more common for people who are older than age 82.  Falls are a major cause of broken bones and head injuries in people who are older than age 82. Take precautions to prevent a fall at home.  Work with your health care provider to learn what changes you can make to improve your health and wellness and to prevent falls. This information is not intended to replace advice given to you by your health care provider. Make sure you discuss any questions you have with your health care provider. Document Revised: 01/16/2019 Document Reviewed: 08/08/2017 Elsevier Patient Education  2020 Elsevier Inc.  

## 2020-04-29 NOTE — Progress Notes (Signed)
I,Carla Sanchez,acting as a scribe for [employer], PA-C.,have documented all relevant documentation on the behalf of Carla Daring, PA-C,as directed by  Carla Daring, PA-C while in the presence of Carla Sanchez, [place].   Established patient visit   Patient: Carla Sanchez   DOB: [birth year]   84 y.o. Female  MRN: [id number] Visit Date: 04/29/2020  Today's healthcare provider: [name], PA-C   Chief Complaint  Patient presents with  . Follow-up   Subjective    HPI Patient had AWV today with [facility]. Lipid/Cholesterol, Follow-up  Last lipid panel Other pertinent labs  Lab Results  Component Value Date   CHOL 230 (H) 06/11/2019   HDL 73 06/11/2019   LDLCALC 140 (H) 06/11/2019   TRIG 95 06/11/2019   CHOLHDL 3.2 06/11/2019   Lab Results  Component Value Date   ALT 16 06/11/2019   AST 23 06/11/2019   PLT 274 06/11/2019   TSH 4.370 06/11/2019     Diet controlled.  Symptoms: No chest pain No chest pressure/discomfort  No dyspnea No lower extremity edema  No numbness or tingling of extremity No orthopnea  No palpitations No paroxysmal nocturnal dyspnea  No speech difficulty No syncope   Current diet: well balanced Current exercise: housecleaning, walking and yard work  The ASCVD Risk score Carla Sanchez DC Jr., et al., 2013) failed to calculate for the following reasons:   The 2013 ASCVD risk score is only valid for ages 48 to 42  --------------------------------------------------------------------------------------------------- Hypothyroidism due to fibrous invasive thyroiditis: Stable. No symptoms. No treatment  Chronic obstructive pulmonary disease, unspecified COPD : Patient stable with her COPD.  BP Readings from Last 3 Encounters:  04/29/20 130/79  12/17/19 102/60  06/19/19 117/77    Patient Active Problem List   Diagnosis Date Noted  . Chronic obstructive pulmonary disease ([place]) 04/15/2018  . Monilial vaginitis  09/11/2017  . Pessary maintenance 09/11/2017  . Cystocele, midline 06/27/2017  . Rectocele 06/27/2017  . Chest pain 10/15/2015  . Allergic rhinitis 10/13/2015  . Abnormal CAT scan 10/13/2015  . Calcification of intervertebral cartilage or disc of cervical region 10/13/2015  . Chronic constipation 10/13/2015  . H/O breast augmentation 10/13/2015  . Calcium blood increased 10/13/2015  . Hypercholesterolemia 10/13/2015  . Lung mass 10/13/2015  . 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 Shortness of breath 10/13/2015  . Abnormal echocardiogram 02/23/2015  . Hypothyroidism due to fibrous invasive thyroiditis 02/23/2015  . CMC arthritis, thumb, degenerative 10/13/2014  . OP (osteoporosis) 09/30/2009  . Benign neoplasm of colon 07/08/2003  . Benign neoplasm of large bowel 07/08/2003  . Colon, diverticulosis 07/08/2003  . Adaptive colitis 07/08/2003  . History of malignant melanoma of skin 09/21/1994  . Disorder of mitral valve 08/02/1993   Past Medical History:  Diagnosis Date  . Arthritis   . BCC (basal cell carcinoma) 04/18/2018  . Cancer ([place])    Malignant Melanoma  . Endometriosis   . Hemorrhoids   . Migraines   . SCCA (squamous cell carcinoma) of skin 04/04/2018   removed from left shoulder   Social History   Socioeconomic History  . Marital status: Married    Spouse name: Not on file  . Number of children: 4  . Years of education: Not on file  . Highest education level: 12th grade  Occupational History  . Not on file  Tobacco Use  . Smoking status: Never Smoker  . Smokeless tobacco: Never Used  Vaping Use  . Vaping Use: Never used  Substance and Sexual Activity  . Alcohol use: Not Currently    Comment: 1-2 drinks rare  . Drug use: No  . Sexual activity: Yes    Birth control/protection: Surgical  Other Topics Concern  . Not on file  Social History Narrative  . Not on file   Social Determinants of Health   Financial Resource Strain: Low Risk   . Difficulty of Paying  Living Expenses: Not hard at all  Food Insecurity: No Food Insecurity  . Worried About Charity fundraiser in the Last Year: Never true  . Ran Out of Food in the Last Year: Never true  Transportation Needs: No Transportation Needs  . Lack of Transportation (Medical): No  . Lack of Transportation (Non-Medical): No  Physical Activity: Inactive  . Days of Exercise per Week: 0 days  . Minutes of Exercise per Session: 0 min  Stress: No Stress Concern Present  . Feeling of Stress : Not at all  Social Connections: Socially Integrated  . Frequency of Communication with Friends and Family: More than three times a week  . Frequency of Social Gatherings with Friends and Family: More than three times a week  . Attends Religious Services: More than 4 times per year  . Active Member of Clubs or Organizations: Yes  . Attends Archivist Meetings: More than 4 times per year  . Marital Status: Married  Human resources officer Violence: Not At Risk  . Fear of Current or Ex-Partner: No  . Emotionally Abused: No  . Physically Abused: No  . Sexually Abused: No   Family History  Problem Relation Age of Onset  . Heart failure Mother   . Arthritis Mother   . Hypertension Father   . Heart attack Father   . Seizures Brother   . Seizures Sister   . Kidney disease Sister   . Cancer Sister        lymphoma  . Cancer Sister   . Breast cancer Sister   . Cancer Brother        prostate  . Heart disease Brother 32       heart attack  . Arthritis Other   . Heart attack Other   . Epilepsy Other   . Thyroid cancer Other   . Myasthenia gravis Other   . Prostate cancer Other   . Cancer Niece        breast cancer       Medications: Outpatient Medications Prior to Visit  Medication Sig  . ascorbic acid (VITAMIN C) 500 MG tablet Take 500 mg by mouth daily.  Marland Kitchen aspirin 81 MG tablet Take 81 mg by mouth daily.  . Biotin 10 MG CAPS Take by mouth daily.   . Calcium Carb-Cholecalciferol (CALCIUM  CARBONATE-VITAMIN D3) 600-400 MG-UNIT TABS Take 1 tablet by mouth daily.  Marland Kitchen co-enzyme Q-10 30 MG capsule Take 30 mg by mouth daily.   . Cyanocobalamin (B-12 PO) Take by mouth.  . Multiple Vitamin (MULTIVITAMIN) tablet Take 1 tablet by mouth daily.  . Omega 3 1000 MG CAPS Take 1 capsule by mouth daily.   Levin Erp SULFATE VAGINAL (TRIMO-SAN) 0.025 % GEL Place 8.78 applicators vaginally once a week.  . pyridoxine (B-6) 100 MG tablet Take 100 mg by mouth daily.  . vitamin B-12 (CYANOCOBALAMIN) 1000 MCG tablet Take 1,000 mcg by mouth daily.   No facility-administered medications prior to visit.    Review of Systems  Constitutional: Negative for appetite change, chills, fatigue and fever.  Respiratory: Negative for chest tightness and shortness of breath.   Cardiovascular: Negative for chest pain and palpitations.  Gastrointestinal: Negative for abdominal pain, nausea and vomiting.  Neurological: Negative for dizziness and weakness.    Last CBC Lab Results  Component Value Date   WBC 8.1 06/11/2019   HGB 14.2 06/11/2019   HCT 44.2 06/11/2019   MCV 90 06/11/2019   MCH 28.7 06/11/2019   RDW 13.3 06/11/2019   PLT 274 32/95/1884   Last metabolic panel Lab Results  Component Value Date   GLUCOSE 86 06/11/2019   NA 137 06/11/2019   K 5.1 06/11/2019   CL 99 06/11/2019   CO2 27 06/11/2019   BUN 10 06/11/2019   CREATININE 0.81 06/11/2019   GFRNONAA 68 06/11/2019   GFRAA 79 06/11/2019   CALCIUM 9.9 06/11/2019   PROT 6.4 06/11/2019   ALBUMIN 4.4 06/11/2019   LABGLOB 2.0 06/11/2019   AGRATIO 2.2 06/11/2019   BILITOT 0.4 06/11/2019   ALKPHOS 78 06/11/2019   AST 23 06/11/2019   ALT 16 06/11/2019   ANIONGAP 7 10/13/2015      Objective    BP 130/79 (BP Location: Left Arm, Patient Position: Sitting, Cuff Size: Large)   Pulse 60   Temp (!) 97.1 F (36.2 C) (Temporal)   Resp 16   Ht 5\' 3"  (1.6 m)   Wt 141 lb 9.6 oz (64.2 kg)   SpO2 95%   BMI 25.08 kg/m  BP Readings  from Last 3 Encounters:  04/29/20 130/79  12/17/19 102/60  06/19/19 117/77   Wt Readings from Last 3 Encounters:  04/29/20 141 lb 9.6 oz (64.2 kg)  12/17/19 145 lb 3.2 oz (65.9 kg)  06/19/19 143 lb 4.8 oz (65 kg)      Physical Exam Constitutional:      Appearance: Normal appearance.  HENT:     Head: Contusion present.     Right Ear: Tympanic membrane, ear canal and external ear normal. There is no impacted cerumen.     Left Ear: Tympanic membrane, ear canal and external ear normal. There is no impacted cerumen.     Nose: Nose normal.     Mouth/Throat:     Mouth: Mucous membranes are moist.     Pharynx: Oropharynx is clear.  Eyes:     General: No scleral icterus.    Extraocular Movements: Extraocular movements intact.     Conjunctiva/sclera: Conjunctivae normal.     Pupils: Pupils are equal, round, and reactive to light.  Neck:     Vascular: No carotid bruit.  Cardiovascular:     Rate and Rhythm: Normal rate and regular rhythm.     Pulses: Normal pulses.     Heart sounds: Normal heart sounds.  Pulmonary:     Effort: Pulmonary effort is normal. No respiratory distress.     Breath sounds: Normal breath sounds. No wheezing or rales.  Abdominal:     General: Abdomen is flat. Bowel sounds are normal.     Palpations: Abdomen is soft. There is no mass.     Tenderness: There is no abdominal tenderness.  Musculoskeletal:        General: No swelling. Normal range of motion.     Cervical back: Normal range of motion and neck supple.  Skin:    General: Skin is warm and dry.     Comments: Contusion noticed on dorsal right hand  Neurological:     Mental Status: She is alert and oriented to person, place, and time.  Psychiatric:        Mood and Affect: Mood normal.        Behavior: Behavior normal.      No results found for any visits on 04/29/20.  Assessment & Plan     Problem List Items Addressed This Visit      Respiratory   Chronic obstructive pulmonary disease ([place])     Stable. No treatment required. No flares recently in over a year.       Relevant Orders   CBC with Differential/Platelet   Comprehensive metabolic panel     Endocrine   Hypothyroidism due to fibrous invasive thyroiditis    Stable.  No medication required. Will check labs as below and f/u pending results.      Relevant Orders   CBC with Differential/Platelet   Comprehensive metabolic panel   Lipid panel   TSH     Other   Hypercholesterolemia - Primary    Stable.  Diet controlled. Will check labs as below and f/u pending results.      Relevant Orders   CBC with Differential/Platelet   Comprehensive metabolic panel      Return in about 1 year (around 04/29/2021).      Reynolds Bowl, PA-C, have reviewed all documentation for this visit. The documentation on 04/29/20 for the exam, diagnosis, procedures, and orders are all accurate and complete.   Rubye Beach  [facility] [phone] (phone) [phone] (fax)  [place]

## 2020-04-29 NOTE — Assessment & Plan Note (Addendum)
Stable.  No medication required. Will check labs as below and f/u pending results.

## 2020-04-29 NOTE — Assessment & Plan Note (Addendum)
Stable.  Diet controlled. Will check labs as below and f/u pending results.

## 2020-04-29 NOTE — Assessment & Plan Note (Addendum)
Stable. No treatment required. No flares recently in over a year.

## 2020-04-30 ENCOUNTER — Telehealth: Payer: Self-pay

## 2020-04-30 LAB — COMPREHENSIVE METABOLIC PANEL
ALT: 15 IU/L (ref 0–32)
AST: 24 IU/L (ref 0–40)
Albumin/Globulin Ratio: 1.9 (ref 1.2–2.2)
Albumin: 4.1 g/dL (ref 3.6–4.6)
Alkaline Phosphatase: 76 IU/L (ref 48–121)
BUN/Creatinine Ratio: 11 — ABNORMAL LOW (ref 12–28)
BUN: 8 mg/dL (ref 8–27)
Bilirubin Total: 0.4 mg/dL (ref 0.0–1.2)
CO2: 22 mmol/L (ref 20–29)
Calcium: 9.7 mg/dL (ref 8.7–10.3)
Chloride: 103 mmol/L (ref 96–106)
Creatinine, Ser: 0.71 mg/dL (ref 0.57–1.00)
GFR calc Af Amer: 92 mL/min/{1.73_m2} (ref >59)
GFR calc non Af Amer: 80 mL/min/{1.73_m2} (ref >59)
Globulin, Total: 2.2 g/dL (ref 1.5–4.5)
Glucose: 83 mg/dL (ref 65–99)
Potassium: 4.9 mmol/L (ref 3.5–5.2)
Sodium: 141 mmol/L (ref 134–144)
Total Protein: 6.3 g/dL (ref 6.0–8.5)

## 2020-04-30 LAB — CBC WITH DIFFERENTIAL/PLATELET
Basophils Absolute: 0 10*3/uL (ref 0.0–0.2)
Basos: 1 %
EOS (ABSOLUTE): 0.1 10*3/uL (ref 0.0–0.4)
Eos: 2 %
Hematocrit: 41.8 % (ref 34.0–46.6)
Hemoglobin: 13.4 g/dL (ref 11.1–15.9)
Immature Grans (Abs): 0 10*3/uL (ref 0.0–0.1)
Immature Granulocytes: 0 %
Lymphocytes Absolute: 1.5 10*3/uL (ref 0.7–3.1)
Lymphs: 29 %
MCH: 28.6 pg (ref 26.6–33.0)
MCHC: 32.1 g/dL (ref 31.5–35.7)
MCV: 89 fL (ref 79–97)
Monocytes Absolute: 0.5 10*3/uL (ref 0.1–0.9)
Monocytes: 9 %
Neutrophils Absolute: 3.2 10*3/uL (ref 1.4–7.0)
Neutrophils: 59 %
Platelets: 219 10*3/uL (ref 150–450)
RBC: 4.68 x10E6/uL (ref 3.77–5.28)
RDW: 13.1 % (ref 11.7–15.4)
WBC: 5.3 10*3/uL (ref 3.4–10.8)

## 2020-04-30 LAB — TSH: TSH: 4.49 u[IU]/mL (ref 0.450–4.500)

## 2020-04-30 LAB — LIPID PANEL
Chol/HDL Ratio: 3.3 ratio (ref 0.0–4.4)
Cholesterol, Total: 199 mg/dL (ref 100–199)
HDL: 61 mg/dL (ref >39)
LDL Chol Calc (NIH): 122 mg/dL — ABNORMAL HIGH (ref 0–99)
Triglycerides: 88 mg/dL (ref 0–149)
VLDL Cholesterol Cal: 16 mg/dL (ref 5–40)

## 2020-04-30 NOTE — Telephone Encounter (Signed)
-----   Message from [name], [place] sent at 04/30/2020 12:52 PM EDT ----- Blood count is normal. Kidney and liver function are normal. Sodium, potassium and calcium are normal. Cholesterol is improved compared to last year. Continue whatever healthy lifestyle changes or supplements you have been using as it is helping. Thyroid is normal.

## 2020-04-30 NOTE — Telephone Encounter (Signed)
[facility] 04/30/2020 PEC please advise pt of labs results when she calls back.   Thanks,   -Mickel Baas

## 2020-05-03 NOTE — Telephone Encounter (Signed)
Phone call to pt.  Advised of result note per Fenton Malling from 04/30/20.  Pt. Verb. Understanding.

## 2020-06-18 NOTE — Progress Notes (Signed)
Pt present for pessary check. Pt stated having vaginal odor.

## 2020-06-22 ENCOUNTER — Encounter: Payer: Self-pay | Admitting: Obstetrics and Gynecology

## 2020-06-22 ENCOUNTER — Ambulatory Visit (INDEPENDENT_AMBULATORY_CARE_PROVIDER_SITE_OTHER): Payer: Medicare Other | Admitting: Obstetrics and Gynecology

## 2020-06-22 ENCOUNTER — Other Ambulatory Visit: Payer: Self-pay

## 2020-06-22 VITALS — BP 116/77 | HR 75 | Ht 63.0 in | Wt 140.5 lb

## 2020-06-22 DIAGNOSIS — N816 Rectocele: Secondary | ICD-10-CM | POA: Diagnosis not present

## 2020-06-22 DIAGNOSIS — N8111 Cystocele, midline: Secondary | ICD-10-CM | POA: Diagnosis not present

## 2020-06-22 DIAGNOSIS — Z4689 Encounter for fitting and adjustment of other specified devices: Secondary | ICD-10-CM | POA: Diagnosis not present

## 2020-06-22 DIAGNOSIS — N952 Postmenopausal atrophic vaginitis: Secondary | ICD-10-CM

## 2020-06-22 MED ORDER — TRIMO-SAN 0.025 % VA GEL: 1.0000 | VAGINAL | 3 refills | Status: DC

## 2020-06-22 NOTE — Progress Notes (Signed)
    GYNECOLOGY PROGRESS NOTE  Subjective:    Patient ID: Carla Sanchez, female    DOB: 1935/11/05, 84 y.o.   MRN: [id number]  HPI  Patient is a 84 y.o. G89P4 female who presents for a 6 month pessary check. She denies major complaints today. She reports no vaginal bleeding. She does note some occasional discharge with odor, but resolves when she takes the pessary out to clean it. She denies pelvic discomfort and difficulty urinating or moving her bowels. Self-maintains the pessary at home, cleans it approximately twice weekly. Uses Trimo-san gel once weekly. Needs a new prescription.   The following portions of the patient's history were reviewed and updated as appropriate: allergies, current medications, past family history, past medical history, past social history, past surgical history and problem list.   Review of Systems Pertinent items noted in HPI and remainder of comprehensive ROS otherwise negative.   Objective:   Blood pressure 116/77, pulse 75, height 5\' 3"  (1.6 m), weight 140 lb 8 oz (63.7 kg). General appearance: alert and no distress Abdomen: soft, non-tender; bowel sounds normal; no masses,  no organomegaly Pelvic: The patient's Size (#5 ring with diaphragm support) pessary was removed, cleaned and replaced without complications). Speculum examination revealed normal vaginal mucosa with no lesions or lacerations. Mild vaginal atrophy.   Assessment:   Pessary maintenance Cystocele (second to third degree) Rectocele (first to second degree) Vaginal atrophy  Plan:   - The patient should return in 6 months for a pessary check and continue to use Trimosan gel weekly as prescribed.  Continued to encourage cleaning as needed.   - Mild vaginal atrophy, asymptomatic.    Rubie Maid, MD [facility]

## 2020-06-24 ENCOUNTER — Other Ambulatory Visit: Payer: Self-pay

## 2020-06-24 ENCOUNTER — Ambulatory Visit (INDEPENDENT_AMBULATORY_CARE_PROVIDER_SITE_OTHER): Payer: Medicare Other

## 2020-06-24 DIAGNOSIS — Z23 Encounter for immunization: Secondary | ICD-10-CM | POA: Diagnosis not present

## 2020-07-23 ENCOUNTER — Other Ambulatory Visit: Payer: Self-pay

## 2020-07-23 ENCOUNTER — Ambulatory Visit (INDEPENDENT_AMBULATORY_CARE_PROVIDER_SITE_OTHER): Payer: Medicare Other

## 2020-07-23 DIAGNOSIS — Z23 Encounter for immunization: Secondary | ICD-10-CM

## 2020-11-23 DIAGNOSIS — I2510 Atherosclerotic heart disease of native coronary artery without angina pectoris: Secondary | ICD-10-CM | POA: Diagnosis not present

## 2020-11-23 DIAGNOSIS — I493 Ventricular premature depolarization: Secondary | ICD-10-CM | POA: Diagnosis not present

## 2020-11-23 DIAGNOSIS — E7800 Pure hypercholesterolemia, unspecified: Secondary | ICD-10-CM | POA: Diagnosis not present

## 2020-11-23 DIAGNOSIS — R002 Palpitations: Secondary | ICD-10-CM | POA: Diagnosis not present

## 2020-11-23 DIAGNOSIS — R079 Chest pain, unspecified: Secondary | ICD-10-CM | POA: Diagnosis not present

## 2020-11-23 DIAGNOSIS — R0602 Shortness of breath: Secondary | ICD-10-CM | POA: Diagnosis not present

## 2020-11-23 DIAGNOSIS — I341 Nonrheumatic mitral (valve) prolapse: Secondary | ICD-10-CM | POA: Diagnosis not present

## 2020-11-23 DIAGNOSIS — I480 Paroxysmal atrial fibrillation: Secondary | ICD-10-CM | POA: Diagnosis not present

## 2020-12-16 DIAGNOSIS — Z9842 Cataract extraction status, left eye: Secondary | ICD-10-CM | POA: Diagnosis not present

## 2020-12-16 DIAGNOSIS — H0288B Meibomian gland dysfunction left eye, upper and lower eyelids: Secondary | ICD-10-CM | POA: Diagnosis not present

## 2020-12-16 DIAGNOSIS — H0288A Meibomian gland dysfunction right eye, upper and lower eyelids: Secondary | ICD-10-CM | POA: Diagnosis not present

## 2020-12-16 DIAGNOSIS — H52223 Regular astigmatism, bilateral: Secondary | ICD-10-CM | POA: Diagnosis not present

## 2020-12-16 DIAGNOSIS — Z9841 Cataract extraction status, right eye: Secondary | ICD-10-CM | POA: Diagnosis not present

## 2020-12-29 ENCOUNTER — Encounter: Payer: Self-pay | Admitting: Obstetrics and Gynecology

## 2020-12-29 ENCOUNTER — Ambulatory Visit (INDEPENDENT_AMBULATORY_CARE_PROVIDER_SITE_OTHER): Payer: Medicare Other | Admitting: Obstetrics and Gynecology

## 2020-12-29 ENCOUNTER — Other Ambulatory Visit: Payer: Self-pay

## 2020-12-29 VITALS — BP 132/74 | HR 71 | Ht 63.0 in | Wt 139.5 lb

## 2020-12-29 DIAGNOSIS — N816 Rectocele: Secondary | ICD-10-CM | POA: Diagnosis not present

## 2020-12-29 DIAGNOSIS — Z4689 Encounter for fitting and adjustment of other specified devices: Secondary | ICD-10-CM | POA: Diagnosis not present

## 2020-12-29 DIAGNOSIS — N952 Postmenopausal atrophic vaginitis: Secondary | ICD-10-CM | POA: Diagnosis not present

## 2020-12-29 DIAGNOSIS — N8111 Cystocele, midline: Secondary | ICD-10-CM

## 2020-12-29 MED ORDER — TRIMO-SAN 0.025 % VA GEL: 1.0000 | VAGINAL | 6 refills | Status: DC

## 2020-12-29 NOTE — Progress Notes (Signed)
    GYNECOLOGY PROGRESS NOTE  Subjective:    Patient ID: Carla Sanchez, female    DOB: Sep 26, 1936, 85 y.o.   MRN: [id number]  HPI  Patient is a 85 y.o. G65P4 female who presents for a 6 month pessary check. She denies major complaints today. She reports no vaginal bleeding. She does note some occasional discharge but is not bothersome. She denies pelvic discomfort and difficulty urinating or moving her bowels. Self-maintains the pessary at home, cleans it approximately twice weekly. Uses Trimo-san gel once weekly.  The following portions of the patient's history were reviewed and updated as appropriate: allergies, current medications, past family history, past medical history, past social history, past surgical history and problem list.   Review of Systems Pertinent items noted in HPI and remainder of comprehensive ROS otherwise negative.   Objective:   Blood pressure 132/74, pulse 71, height 5\' 3"  (1.6 m), weight 139 lb 8 oz (63.3 kg). General appearance: alert and no distress Abdomen: soft, non-tender; bowel sounds normal; no masses,  no organomegaly Pelvic: The patient's Size (#5 ring with diaphragm support) pessary was removed, cleaned and replaced without complications). Speculum examination revealed normal vaginal mucosa with no lesions or lacerations. Mild vaginal atrophy.   Assessment:   Pessary maintenance Cystocele (second to third degree) Rectocele (first to second degree) Vaginal atrophy  Plan:   - The patient was previously doing q 6 month visits, but is maintaining well so can stretch to yearly visits.  - continue to use Trimosan gel weekly as prescribed.  Refill given. Continued to encourage cleaning as needed.   - Mild vaginal atrophy, asymptomatic. No treatment needed.    Rubie Maid, MD [facility]

## 2020-12-29 NOTE — Progress Notes (Signed)
Pt present for routine pessary maintenance. Pt stated that she was doing well and denies any issues with her pessary at this time.

## 2020-12-30 DIAGNOSIS — Z85828 Personal history of other malignant neoplasm of skin: Secondary | ICD-10-CM | POA: Diagnosis not present

## 2020-12-30 DIAGNOSIS — D485 Neoplasm of uncertain behavior of skin: Secondary | ICD-10-CM | POA: Diagnosis not present

## 2020-12-30 DIAGNOSIS — L821 Other seborrheic keratosis: Secondary | ICD-10-CM | POA: Diagnosis not present

## 2020-12-30 DIAGNOSIS — C441191 Basal cell carcinoma of skin of left upper eyelid, including canthus: Secondary | ICD-10-CM | POA: Diagnosis not present

## 2020-12-30 DIAGNOSIS — L570 Actinic keratosis: Secondary | ICD-10-CM | POA: Diagnosis not present

## 2020-12-30 DIAGNOSIS — D2262 Melanocytic nevi of left upper limb, including shoulder: Secondary | ICD-10-CM | POA: Diagnosis not present

## 2020-12-30 DIAGNOSIS — Z85820 Personal history of malignant melanoma of skin: Secondary | ICD-10-CM | POA: Diagnosis not present

## 2020-12-30 DIAGNOSIS — Z86006 Personal history of melanoma in-situ: Secondary | ICD-10-CM | POA: Diagnosis not present

## 2021-03-02 DIAGNOSIS — C441191 Basal cell carcinoma of skin of left upper eyelid, including canthus: Secondary | ICD-10-CM | POA: Diagnosis not present

## 2021-03-02 DIAGNOSIS — L578 Other skin changes due to chronic exposure to nonionizing radiation: Secondary | ICD-10-CM | POA: Diagnosis not present

## 2021-03-02 DIAGNOSIS — L814 Other melanin hyperpigmentation: Secondary | ICD-10-CM | POA: Diagnosis not present

## 2021-03-02 DIAGNOSIS — Z85828 Personal history of other malignant neoplasm of skin: Secondary | ICD-10-CM | POA: Diagnosis not present

## 2021-05-05 ENCOUNTER — Encounter: Payer: Self-pay | Admitting: Physician Assistant

## 2021-05-27 ENCOUNTER — Telehealth: Payer: Self-pay

## 2021-05-27 NOTE — Telephone Encounter (Signed)
Copied from [place] [id number]. Topic: Appointment Scheduling - Scheduling Inquiry for Clinic >> May 27, 2021 12:11 PM Oneta Rack wrote: Reason for CRM:  patient had to cancel her 05/30/2021 establish care appointment with Dr. Brita Romp due to her brother passing. The funeral is on Monday 8/22/222 at 3pm and patient wanted to know if PCP can work her that morning or work her in sooner then her [facility] appointment for October. Please call patient back at [phone]

## 2021-05-30 ENCOUNTER — Ambulatory Visit: Payer: Medicare Other | Admitting: Family Medicine

## 2021-05-30 NOTE — Telephone Encounter (Signed)
Patient will keep appt in October.

## 2021-05-30 NOTE — Telephone Encounter (Signed)
It looks like she could be fit in on 8/25 at 1020. Can look for any of the slots that I have ok'd for double booking and labeled ok for 20

## 2021-06-14 ENCOUNTER — Other Ambulatory Visit: Payer: Self-pay

## 2021-06-14 ENCOUNTER — Ambulatory Visit (INDEPENDENT_AMBULATORY_CARE_PROVIDER_SITE_OTHER): Payer: Medicare Other | Admitting: Family Medicine

## 2021-06-14 DIAGNOSIS — Z23 Encounter for immunization: Secondary | ICD-10-CM

## 2021-07-19 ENCOUNTER — Other Ambulatory Visit: Payer: Self-pay

## 2021-07-19 ENCOUNTER — Encounter: Payer: Self-pay | Admitting: Family Medicine

## 2021-07-19 ENCOUNTER — Ambulatory Visit (INDEPENDENT_AMBULATORY_CARE_PROVIDER_SITE_OTHER): Payer: Medicare Other | Admitting: Family Medicine

## 2021-07-19 VITALS — BP 123/70 | HR 72 | Temp 98.2°F | Resp 16 | Ht 63.0 in | Wt 139.1 lb

## 2021-07-19 DIAGNOSIS — G629 Polyneuropathy, unspecified: Secondary | ICD-10-CM

## 2021-07-19 DIAGNOSIS — I480 Paroxysmal atrial fibrillation: Secondary | ICD-10-CM | POA: Diagnosis not present

## 2021-07-19 DIAGNOSIS — E038 Other specified hypothyroidism: Secondary | ICD-10-CM | POA: Diagnosis not present

## 2021-07-19 DIAGNOSIS — J449 Chronic obstructive pulmonary disease, unspecified: Secondary | ICD-10-CM | POA: Diagnosis not present

## 2021-07-19 DIAGNOSIS — E7800 Pure hypercholesterolemia, unspecified: Secondary | ICD-10-CM | POA: Diagnosis not present

## 2021-07-19 DIAGNOSIS — R6889 Other general symptoms and signs: Secondary | ICD-10-CM

## 2021-07-19 NOTE — Assessment & Plan Note (Signed)
Reviewed last lipid panel Not currently on a statin Recheck FLP and CMP Discussed diet and exercise  

## 2021-07-19 NOTE — Assessment & Plan Note (Signed)
stable No treatment required No exacerbations in >72yr

## 2021-07-19 NOTE — Assessment & Plan Note (Signed)
New problem Monofilament intact Good circulation/pulses No h/o alcoholism or diabetes Consider gabapentin Check B12

## 2021-07-19 NOTE — Assessment & Plan Note (Signed)
In NSR today Continue aspirin F/b Cardiology No rate/rhythm control

## 2021-07-19 NOTE — Assessment & Plan Note (Signed)
Longstanding and stable Asymptomatic Not on synthroid - never on this Recheck TSH and free T4

## 2021-07-19 NOTE — Progress Notes (Signed)
Established patient visit   Patient: Carla Sanchez   DOB: [birth year]   85 y.o. Female  MRN: [id number] Visit Date: 07/19/2021  Today's healthcare provider: Lavon Paganini, MD   Chief Complaint  Patient presents with   Hyperlipidemia   Hypothyroidism   COPD   Subjective    HPI  Lipid/Cholesterol, Follow-up  Last lipid panel Other pertinent labs  Lab Results  Component Value Date   CHOL 199 04/29/2020   HDL 61 04/29/2020   LDLCALC 122 (H) 04/29/2020   TRIG 88 04/29/2020   CHOLHDL 3.3 04/29/2020   Lab Results  Component Value Date   ALT 15 04/29/2020   AST 24 04/29/2020   PLT 219 04/29/2020   TSH 4.490 04/29/2020     She was last seen for this 1 years ago.  Management since that visit includes no changes.  No oral medications  Symptoms: No chest pain No chest pressure/discomfort  No dyspnea No lower extremity edema  Yes numbness or tingling of extremity No orthopnea  No palpitations No paroxysmal nocturnal dyspnea  No speech difficulty No syncope   Current diet: in general, a "healthy" diet   Current exercise: none  The ASCVD Risk score (Arnett DK, et al., 2019) failed to calculate for the following reasons:   The 2019 ASCVD risk score is only valid for ages 8 to 7  --------------------------------------------------------------------------------------------------- Hypothyroid, follow-up  Lab Results  Component Value Date   TSH 4.490 04/29/2020   TSH 4.370 06/11/2019   TSH 4.880 (H) 04/15/2018   T4TOTAL 9.1 07/11/2017   Wt Readings from Last 3 Encounters:  07/19/21 139 lb 1.6 oz (63.1 kg)  12/29/20 139 lb 8 oz (63.3 kg)  06/22/20 140 lb 8 oz (63.7 kg)    She was last seen for hypothyroid 1 years ago.  Management since that visit includes no changes. No oral medications  Symptoms: No change in energy level No constipation  No diarrhea No heat / cold intolerance  No nervousness No palpitations  No weight changes     ----------------------------------------------------------------------------------------- COPD, Follow up  She was last seen for this 1 years ago. Changes made include no changes.   No medications She is NOT experiencing cough, wheezing, weight increase, weight loss, chills, or fever. she reports breathing is Unchanged.  Pulmonary Functions Testing Results:  No results found for: FEV1, FVC, FEV1FVC, TLC  -----------------------------------------------------------------------------------------  Pain, numbness, and cold feeling in b/l feet intermittently over the last several months.     Medications: Outpatient Medications Prior to Visit  Medication Sig   ascorbic acid (VITAMIN C) 500 MG tablet Take 500 mg by mouth daily.   aspirin 81 MG tablet Take 81 mg by mouth daily.   Biotin 10 MG CAPS Take by mouth daily.    Calcium Carb-Cholecalciferol (CALCIUM CARBONATE-VITAMIN D3) 600-400 MG-UNIT TABS Take 1 tablet by mouth daily.   Cyanocobalamin (B-12 PO) Take by mouth.   Multiple Vitamin (MULTIVITAMIN) tablet Take 1 tablet by mouth daily.   OXYQUINOLONE SULFATE VAGINAL (TRIMO-SAN) 0.025 % GEL Place 1 applicator vaginally once a week. Fill to 1/4 to 1/2 of of applicator   pyridoxine (B-6) 100 MG tablet Take 100 mg by mouth daily.   vitamin B-12 (CYANOCOBALAMIN) 1000 MCG tablet Take 1,000 mcg by mouth daily.   No facility-administered medications prior to visit.    Review of Systems  Constitutional:  Negative for activity change, appetite change, chills, fatigue, fever and unexpected weight change.  Respiratory:  Negative for  chest tightness and shortness of breath.   Cardiovascular:  Negative for chest pain, palpitations and leg swelling.  Neurological:  Positive for numbness.       Objective    BP 123/70 (BP Location: Left Arm, Patient Position: Sitting, Cuff Size: Normal)   Pulse 72   Temp 98.2 F (36.8 C) (Oral)   Resp 16   Ht 5\' 3"  (1.6 m)   Wt 139 lb 1.6 oz (63.1  kg)   SpO2 99%   BMI 24.64 kg/m  BP Readings from Last 3 Encounters:  07/19/21 123/70  12/29/20 132/74  06/22/20 116/77   Wt Readings from Last 3 Encounters:  07/19/21 139 lb 1.6 oz (63.1 kg)  12/29/20 139 lb 8 oz (63.3 kg)  06/22/20 140 lb 8 oz (63.7 kg)      Physical Exam Vitals reviewed.  Constitutional:      General: She is not in acute distress.    Appearance: Normal appearance. She is well-developed. She is not diaphoretic.  HENT:     Head: Normocephalic and atraumatic.  Eyes:     General: No scleral icterus.    Conjunctiva/sclera: Conjunctivae normal.  Neck:     Thyroid: No thyromegaly.  Cardiovascular:     Rate and Rhythm: Normal rate and regular rhythm.     Pulses: Normal pulses.     Heart sounds: Normal heart sounds. No murmur heard. Pulmonary:     Effort: Pulmonary effort is normal. No respiratory distress.     Breath sounds: Normal breath sounds. No wheezing, rhonchi or rales.  Musculoskeletal:     Cervical back: Neck supple.     Right lower leg: No edema.     Left lower leg: No edema.  Lymphadenopathy:     Cervical: No cervical adenopathy.  Skin:    General: Skin is warm and dry.     Findings: No rash.  Neurological:     Mental Status: She is alert and oriented to person, place, and time. Mental status is at baseline.     Comments: Monofilament intact in both feet  Psychiatric:        Mood and Affect: Mood normal.        Behavior: Behavior normal.      No results found for any visits on 07/19/21.  Assessment & Plan     Problem List Items Addressed This Visit       Cardiovascular and Mediastinum   Paroxysmal atrial fibrillation (HCC)    In NSR today Continue aspirin F/b Cardiology No rate/rhythm control        Respiratory   Chronic obstructive pulmonary disease (HCC)    stable No treatment required No exacerbations in >11yr        Endocrine   Subclinical hypothyroidism    Longstanding and stable Asymptomatic Not on synthroid  - never on this Recheck TSH and free T4      Relevant Orders   TSH + free T4     Nervous and Auditory   Neuropathy    New problem Monofilament intact Good circulation/pulses No h/o alcoholism or diabetes Consider gabapentin Check B12      Relevant Orders   B12     Other   Hypercholesterolemia - Primary    Reviewed last lipid panel Not currently on a statin Recheck FLP and CMP Discussed diet and exercise       Relevant Orders   Lipid panel   Comprehensive metabolic panel   Other Visit Diagnoses  Other general symptoms and signs        Relevant Orders   B12        Return in about 6 months (around 01/17/2022) for AWV, chronic disease f/u, With new PCP.      I, Lavon Paganini, MD, have reviewed all documentation for this visit. The documentation on 07/19/21 for the exam, diagnosis, procedures, and orders are all accurate and complete.   Parish Dubose, Dionne Bucy, MD, MPH [facility]

## 2021-07-20 LAB — LIPID PANEL
Chol/HDL Ratio: 3.2 ratio (ref 0.0–4.4)
Cholesterol, Total: 209 mg/dL — ABNORMAL HIGH (ref 100–199)
HDL: 65 mg/dL (ref >39)
LDL Chol Calc (NIH): 126 mg/dL — ABNORMAL HIGH (ref 0–99)
Triglycerides: 102 mg/dL (ref 0–149)
VLDL Cholesterol Cal: 18 mg/dL (ref 5–40)

## 2021-07-20 LAB — COMPREHENSIVE METABOLIC PANEL
ALT: 11 IU/L (ref 0–32)
AST: 20 IU/L (ref 0–40)
Albumin/Globulin Ratio: 2.3 — ABNORMAL HIGH (ref 1.2–2.2)
Albumin: 4.2 g/dL (ref 3.6–4.6)
Alkaline Phosphatase: 91 IU/L (ref 44–121)
BUN/Creatinine Ratio: 17 (ref 12–28)
BUN: 12 mg/dL (ref 8–27)
Bilirubin Total: 0.4 mg/dL (ref 0.0–1.2)
CO2: 27 mmol/L (ref 20–29)
Calcium: 9.8 mg/dL (ref 8.7–10.3)
Chloride: 100 mmol/L (ref 96–106)
Creatinine, Ser: 0.71 mg/dL (ref 0.57–1.00)
Globulin, Total: 1.8 g/dL (ref 1.5–4.5)
Glucose: 91 mg/dL (ref 70–99)
Potassium: 5.4 mmol/L — ABNORMAL HIGH (ref 3.5–5.2)
Sodium: 140 mmol/L (ref 134–144)
Total Protein: 6 g/dL (ref 6.0–8.5)
eGFR: 84 mL/min/{1.73_m2} (ref >59)

## 2021-07-20 LAB — VITAMIN B12: Vitamin B-12: 1042 pg/mL (ref 232–1245)

## 2021-07-20 LAB — TSH+FREE T4
Free T4: 1.32 ng/dL (ref 0.82–1.77)
TSH: 4.77 u[IU]/mL — ABNORMAL HIGH (ref 0.450–4.500)

## 2021-07-22 ENCOUNTER — Telehealth: Payer: Self-pay

## 2021-07-22 DIAGNOSIS — E875 Hyperkalemia: Secondary | ICD-10-CM

## 2021-07-22 NOTE — Telephone Encounter (Signed)
-----   Message from Virginia Crews, MD sent at 07/22/2021  2:04 PM EDT ----- Normal/stable labs, except for potassium is slightly elevated.  Recommend hydrating well and cutting back on potassium containing foods.  Repeat BMP in 2 weeks.

## 2021-08-02 DIAGNOSIS — E875 Hyperkalemia: Secondary | ICD-10-CM | POA: Diagnosis not present

## 2021-08-03 LAB — BASIC METABOLIC PANEL
BUN/Creatinine Ratio: 10 — ABNORMAL LOW (ref 12–28)
BUN: 8 mg/dL (ref 8–27)
CO2: 28 mmol/L (ref 20–29)
Calcium: 10.1 mg/dL (ref 8.7–10.3)
Chloride: 98 mmol/L (ref 96–106)
Creatinine, Ser: 0.84 mg/dL (ref 0.57–1.00)
Glucose: 101 mg/dL — ABNORMAL HIGH (ref 70–99)
Potassium: 5.3 mmol/L — ABNORMAL HIGH (ref 3.5–5.2)
Sodium: 135 mmol/L (ref 134–144)
eGFR: 69 mL/min/{1.73_m2} (ref >59)

## 2021-08-05 ENCOUNTER — Telehealth: Payer: Self-pay | Admitting: Obstetrics and Gynecology

## 2021-08-05 NOTE — Telephone Encounter (Signed)
Carla Sanchez called in and states she believes she threw her pessary out.  She states she has no idea what happened to it and checked her pockets and everything and states "it is just gone"  She wanted to know if another one could be ordered. Patient states she is very flustered and when she called she originally asked to speak to Dr. Buckner Malta nurse.  When I told the patient Dr. Keturah Barre is no longer at the practice she kept referring to Dr. Keturah Barre as a female.  Please advise.

## 2021-08-05 NOTE — Telephone Encounter (Signed)
Spoke to pt and she stated that she took her pessary out to clear it and placed it in a paper towel. Pt stated that she think she may have threw it away. Pt is aware that it may take up to 2-3 weeks or longer to order a new one. Pt voiced that she understood.

## 2021-08-19 NOTE — Telephone Encounter (Signed)
Pt called and is aware that her pessary is ready for pick up.

## 2021-12-05 DIAGNOSIS — R002 Palpitations: Secondary | ICD-10-CM | POA: Diagnosis not present

## 2021-12-05 DIAGNOSIS — I2510 Atherosclerotic heart disease of native coronary artery without angina pectoris: Secondary | ICD-10-CM | POA: Diagnosis not present

## 2021-12-05 DIAGNOSIS — R079 Chest pain, unspecified: Secondary | ICD-10-CM | POA: Diagnosis not present

## 2021-12-05 DIAGNOSIS — I341 Nonrheumatic mitral (valve) prolapse: Secondary | ICD-10-CM | POA: Diagnosis not present

## 2021-12-05 DIAGNOSIS — R0602 Shortness of breath: Secondary | ICD-10-CM | POA: Diagnosis not present

## 2021-12-05 DIAGNOSIS — I493 Ventricular premature depolarization: Secondary | ICD-10-CM | POA: Diagnosis not present

## 2021-12-05 DIAGNOSIS — E7800 Pure hypercholesterolemia, unspecified: Secondary | ICD-10-CM | POA: Diagnosis not present

## 2021-12-28 NOTE — Progress Notes (Signed)
? ? ?  GYNECOLOGY PROGRESS NOTE ? ?Subjective:  ? ? Patient ID: Carla Sanchez, female    DOB: November 06, 1935, 86 y.o.   MRN: [id number] ? ?HPI ? Patient is a 86 y.o. G42P4 female who presents for 1 year pessary maintenance. She denies major complaints today. She reports no vaginal bleeding. She still notes some occasional discharge but is not bothersome. She denies pelvic discomfort and difficulty urinating or moving her bowels. Self-maintains the pessary at home, cleans it approximately twice weekly. Uses Trimo-san gel once weekly. ? ?The following portions of the patient's history were reviewed and updated as appropriate:  ? ?She  has a past medical history of Arthritis, BCC (basal cell carcinoma) (04/18/2018), Cancer ([place]), Endometriosis, Hemorrhoids, Migraines, and SCCA (squamous cell carcinoma) of skin (04/04/2018). ? ?She  has a past surgical history that includes Hemorrhoidectomy with hemorrhoid banding; Joint replacement; Bladder Tack; Breast surgery; Colonoscopy with propofol (N/A, 03/29/2015); Lung surgery; Hand surgery (Right, 04/2015); Abdominal hysterectomy; and bladder sugery (2008). ? ?Her family history includes Arthritis in her mother and another family member; Breast cancer in her sister; Cancer in her brother, niece, sister, and sister; Epilepsy in an other family member; Heart attack in her father and another family member; Heart disease (age of onset: 23) in her brother; Heart failure in her mother; Hypertension in her father; Kidney disease in her sister; Myasthenia gravis in an other family member; Prostate cancer in an other family member; Seizures in her brother and sister; Thyroid cancer in an other family member. ? ?She  reports that she has never smoked. She has never used smokeless tobacco. She reports that she does not currently use alcohol. She reports that she does not use drugs. ? ?She has a current medication list which includes the following prescription(s): ascorbic acid, aspirin,  biotin, calcium carbonate-vitamin d3, cyanocobalamin, multivitamin, trimo-san, pyridoxine, and vitamin b-12. ? ?She is allergic to celebrex [celecoxib]. ? ?Review of Systems ?Pertinent items noted in HPI and remainder of comprehensive ROS otherwise negative.  ? ?Objective:  ? Blood pressure (!) 122/53, pulse 68, resp. rate 16, height '5\' 3"'$  (1.6 m), weight 138 lb 11.2 oz (62.9 kg).  Body mass index is 24.57 kg/m?. ? ?General appearance: alert and no distress ?Abdomen: soft, non-tender; bowel sounds normal; no masses,  no organomegaly ?Pelvic: The patient's Size (#5 ring with diaphragm support) pessary was removed, cleaned and replaced without complications). Speculum examination revealed normal vaginal mucosa with no lesions or lacerations. Mild to moderate vaginal atrophy.  ?  ? ? ?Assessment:  ? ?1. Pessary maintenance   ?2. Cystocele, midline   ?3. Rectocele   ?4. Vaginal atrophy   ?  ? ?Plan:  ? ?- Continue to use Trimosan gel weekly as prescribed.  Refill given. Continued to encourage cleaning as needed.   ?- Mild vaginal atrophy, asymptomatic. No treatment needed.  ?- RTC in 1 year for pessary maintenance ? ?Rubie Maid, MD ?[facility]  ?

## 2021-12-29 ENCOUNTER — Other Ambulatory Visit: Payer: Self-pay

## 2021-12-29 ENCOUNTER — Ambulatory Visit (INDEPENDENT_AMBULATORY_CARE_PROVIDER_SITE_OTHER): Payer: Medicare Other | Admitting: Obstetrics and Gynecology

## 2021-12-29 ENCOUNTER — Encounter: Payer: Self-pay | Admitting: Obstetrics and Gynecology

## 2021-12-29 VITALS — BP 122/53 | HR 68 | Resp 16 | Ht 63.0 in | Wt 138.7 lb

## 2021-12-29 DIAGNOSIS — H0288B Meibomian gland dysfunction left eye, upper and lower eyelids: Secondary | ICD-10-CM | POA: Diagnosis not present

## 2021-12-29 DIAGNOSIS — H52223 Regular astigmatism, bilateral: Secondary | ICD-10-CM | POA: Diagnosis not present

## 2021-12-29 DIAGNOSIS — N952 Postmenopausal atrophic vaginitis: Secondary | ICD-10-CM

## 2021-12-29 DIAGNOSIS — N8111 Cystocele, midline: Secondary | ICD-10-CM

## 2021-12-29 DIAGNOSIS — Z9841 Cataract extraction status, right eye: Secondary | ICD-10-CM | POA: Diagnosis not present

## 2021-12-29 DIAGNOSIS — N816 Rectocele: Secondary | ICD-10-CM | POA: Diagnosis not present

## 2021-12-29 DIAGNOSIS — Z4689 Encounter for fitting and adjustment of other specified devices: Secondary | ICD-10-CM

## 2021-12-29 DIAGNOSIS — Z9842 Cataract extraction status, left eye: Secondary | ICD-10-CM | POA: Diagnosis not present

## 2021-12-29 DIAGNOSIS — H0288A Meibomian gland dysfunction right eye, upper and lower eyelids: Secondary | ICD-10-CM | POA: Diagnosis not present

## 2021-12-29 NOTE — Patient Instructions (Signed)
How to Use a Vaginal Pessary ?A vaginal pessary is a removable device that is placed into your vagina to support pelvic organs that droop. These organs include your uterus, bladder, and rectum. When your pelvic organs drop down into your vagina, it causes a condition called pelvic organ prolapse (POP). ?A pessary may be an alternative to surgery for women with POP. It may help women who leak urine when they strain or exercise (stress incontinence). This is a symptom of POP. A vaginal pessary may also be a temporary treatment for stress incontinence during pregnancy. ?There are several types of pessaries. All types are usually made of silicone. You can insert and remove some on your own. Other types must be inserted and removed by your health care provider at office visits. The reason you are using a pessary and the severity of your condition will determine which one is best for you. ?It is also important to find the right size. A pessary that is too small may fall out. A pessary that is too large may cause pain or discomfort. Your health care provider will do a physical exam to find the correct size and fit for your pessary. It may take several appointments to find the best fit for you. ?If you can be fit with the type of pessary that you can insert, remove, and clean yourself, your health care provider will teach you how to use your pessary at home. You may have checkups every few months. If you have the type of pessary that needs to be inserted and removed by your health care provider, you will have appointments every few months to have the pessary removed, cleaned, and replaced. ?What are the risks? ?When properly fitted and cared for, risks of using a vaginal pessary can be small. However, there can be problems that may include: ?Vaginal discharge. ?Vaginal bleeding. ?A bad smell coming from your vagina. ?Scraping of the skin inside your vagina. ?How to use your pessary ?Follow your health care provider's  instructions for using a pessary. These instructions may vary, depending on the type of pessary you have. ?To insert a pessary: ?Wash your hands with soap and water for at least 20 seconds. ?Squeeze or fold the pessary in half and lubricate the tip with a water-based lubricant. ?Insert the pessary into your vagina. It will unfold and provide support. ?To remove the pessary, gently tug it out of your vagina. You can remove the pessary every night or after several days. You can also remove it to have sex. ?How to care for your pessary ?If you have a pessary that you can remove: ?Clean your pessary with soap and water. Rinse well. ?Dry it completely before inserting it back into your vagina. ?Follow these instructions at home: ?Take over-the-counter and prescription medicines only as told by your health care provider. Your health care provider may prescribe an estrogen cream to moisten your vagina. ?Keep all follow-up visits. This is important. ?Contact a health care provider if: ?You feel any pain or discomfort when your pessary is in place. ?You continue to have stress incontinence. ?You have trouble keeping your pessary from falling out. ?You have an unusual vaginal discharge that is blood-tinged or smells bad. ?Summary ?A vaginal pessary is a removable device that is placed into your vagina to support pelvic organs that droop. This condition is called pelvic organ prolapse (POP). ?There are several types of pessaries. Some you can insert and remove on your own. Others must be inserted and removed  by your health care provider. ?The best type for you depends on the reason you are using a pessary and the severity of your condition. It is also important to find the right size. ?If you can use the type that you insert and remove on your own, your health care provider will teach you how to use it and schedule checkups every few months. ?If you have the type that needs to be inserted and removed by your health care  provider, you will have regular appointments to have your pessary removed, cleaned, and replaced. ?This information is not intended to replace advice given to you by your health care provider. Make sure you discuss any questions you have with your health care provider. ?Document Revised: 03/25/2020 Document Reviewed: 03/25/2020 ?Elsevier Patient Education ? [place]. ? ?

## 2022-01-16 NOTE — Progress Notes (Signed)
? ?I,Elena D DeSanto,acting as a scribe for Lavon Paganini, MD.,have documented all relevant documentation on the behalf of Lavon Paganini, MD,as directed by  Lavon Paganini, MD while in the presence of Lavon Paganini, MD. ?  ? ? ?Established patient visit ? ? ?Patient: Carla Sanchez   DOB: Dec 06, 1935   86 y.o. Female  MRN: [id number] ?Visit Date: 01/17/2022 ? ?Today's healthcare provider: Lavon Paganini, MD  ? ?Chief Complaint  ?Patient presents with  ? Follow-up  ? ?Subjective  ?  ?HPI  ?Lipid/Cholesterol, Follow-up ? ?Last lipid panel Other pertinent labs  ?Lab Results  ?Component Value Date  ? CHOL 209 (H) 07/19/2021  ? HDL 65 07/19/2021  ? LDLCALC 126 (H) 07/19/2021  ? TRIG 102 07/19/2021  ? CHOLHDL 3.2 07/19/2021  ? Lab Results  ?Component Value Date  ? ALT 11 07/19/2021  ? AST 20 07/19/2021  ? PLT 219 04/29/2020  ? TSH 4.770 (H) 07/19/2021  ?  ? ?She was last seen for this 6 months ago.  ?Management since that visit includes no changes. Check labs. ?Normal/stable labs, except for potassium is slightly elevated.  Recommend hydrating well and cutting back on potassium containing foods.   ? ?08/04/21 Potassium is decreasing and almost to normal range. Keep up with diet changes and hydrating well. Recheck at next visit. ? ?Not currently on statin ? ?Symptoms: ?No chest pain No chest pressure/discomfort  ?No dyspnea No lower extremity edema  ?No numbness or tingling of extremity No orthopnea  ?No palpitations No paroxysmal nocturnal dyspnea  ?No speech difficulty No syncope  ? ? ? ?The ASCVD Risk score (Arnett DK, et al., 2019) failed to calculate for the following reasons: ?  The 2019 ASCVD risk score is only valid for ages 11 to 77 ? ?--------------------------------------------------------------------------------------------------- ?High Potassium ?Patient states that her potassium had been high.  She presents for recheck today.  Shse did complain that she had leg cramps that are now  better.  ? ?Medications: ?Outpatient Medications Prior to Visit  ?Medication Sig  ? ascorbic acid (VITAMIN C) 500 MG tablet Take 500 mg by mouth daily.  ? aspirin 81 MG tablet Take 81 mg by mouth daily.  ? Biotin 10 MG CAPS Take by mouth daily.   ? Calcium Carb-Cholecalciferol (CALCIUM CARBONATE-VITAMIN D3) 600-400 MG-UNIT TABS Take 1 tablet by mouth daily.  ? Cyanocobalamin (B-12 PO) Take by mouth.  ? Multiple Vitamin (MULTIVITAMIN) tablet Take 1 tablet by mouth daily.  ? OXYQUINOLONE SULFATE VAGINAL (TRIMO-SAN) 0.025 % GEL Place 1 applicator vaginally once a week. Fill to 1/4 to 1/2 of of applicator  ? pyridoxine (B-6) 100 MG tablet Take 100 mg by mouth daily.  ? vitamin B-12 (CYANOCOBALAMIN) 1000 MCG tablet Take 1,000 mcg by mouth daily.  ? ?No facility-administered medications prior to visit.  ? ? ?Review of Systems per HPI ? ? ?  Objective  ?  ?BP 130/73 (BP Location: Left Arm, Patient Position: Sitting, Cuff Size: Normal)   Pulse 67   Temp 98.7 ?F (37.1 ?C) (Oral)   Wt 138 lb (62.6 kg)   SpO2 99%   BMI 24.45 kg/m?  ? ? ?Physical Exam ?Vitals reviewed.  ?Constitutional:   ?   General: She is not in acute distress. ?   Appearance: Normal appearance. She is well-developed. She is not diaphoretic.  ?HENT:  ?   Head: Normocephalic and atraumatic.  ?Eyes:  ?   General: No scleral icterus. ?   Conjunctiva/sclera: Conjunctivae normal.  ?Neck:  ?  Thyroid: No thyromegaly.  ?Cardiovascular:  ?   Rate and Rhythm: Normal rate and regular rhythm.  ?   Pulses: Normal pulses.  ?   Heart sounds: Normal heart sounds. No murmur heard. ?Pulmonary:  ?   Effort: Pulmonary effort is normal. No respiratory distress.  ?   Breath sounds: Normal breath sounds. No wheezing, rhonchi or rales.  ?Musculoskeletal:  ?   Cervical back: Neck supple.  ?   Right lower leg: No edema.  ?   Left lower leg: No edema.  ?Lymphadenopathy:  ?   Cervical: No cervical adenopathy.  ?Skin: ?   General: Skin is warm and dry.  ?   Findings: No rash.   ?Neurological:  ?   Mental Status: She is alert and oriented to person, place, and time. Mental status is at baseline.  ?Psychiatric:     ?   Mood and Affect: Mood normal.     ?   Behavior: Behavior normal.  ?  ? ? ?No results found for any visits on 01/17/22. ? Assessment & Plan  ?  ? ?Problem List Items Addressed This Visit   ? ?  ? Cardiovascular and Mediastinum  ? Paroxysmal atrial fibrillation (HCC) - Primary  ?  In NSR today ?Continue ASA ?F/b cardiology ?No rate/rhythm control ?  ?  ?  ? Respiratory  ? Chronic obstructive pulmonary disease (HCC)  ?  Stable ?Not on meds ?No exacerbation in >2 yrs ?  ?  ?  ? Endocrine  ? Subclinical hypothyroidism  ?  Longstanding and stable ?Asymptomatic ?Not on meds  ?Recheck TSH and free T4 ?  ?  ? Relevant Orders  ? TSH + free T4  ?  ? Other  ? Hypercholesterolemia  ?  Reviewed last lipid panel ?Not currently on a statin ?Recheck FLP and CMP annually ?Discussed diet and exercise  ?  ?  ? Hyperkalemia  ?  Advised on decr K foods (she had previously increased and does take prunes daily) ?Will recheck today and make further plans ?  ?  ? Relevant Orders  ? Basic metabolic panel  ?  ? ?Return in about 6 months (around 07/19/2022) for AWV.  ?   ? ?I, Lavon Paganini, MD, have reviewed all documentation for this visit. The documentation on 01/17/22 for the exam, diagnosis, procedures, and orders are all accurate and complete. ? ? ?Virginia Crews, MD, MPH ?[facility] ?[facility]   ?

## 2022-01-17 ENCOUNTER — Encounter: Payer: Self-pay | Admitting: Family Medicine

## 2022-01-17 ENCOUNTER — Ambulatory Visit (INDEPENDENT_AMBULATORY_CARE_PROVIDER_SITE_OTHER): Payer: Medicare Other | Admitting: Family Medicine

## 2022-01-17 VITALS — BP 130/73 | HR 67 | Temp 98.7°F | Wt 138.0 lb

## 2022-01-17 DIAGNOSIS — E038 Other specified hypothyroidism: Secondary | ICD-10-CM | POA: Diagnosis not present

## 2022-01-17 DIAGNOSIS — E7800 Pure hypercholesterolemia, unspecified: Secondary | ICD-10-CM

## 2022-01-17 DIAGNOSIS — I480 Paroxysmal atrial fibrillation: Secondary | ICD-10-CM

## 2022-01-17 DIAGNOSIS — E875 Hyperkalemia: Secondary | ICD-10-CM | POA: Diagnosis not present

## 2022-01-17 DIAGNOSIS — J449 Chronic obstructive pulmonary disease, unspecified: Secondary | ICD-10-CM | POA: Diagnosis not present

## 2022-01-17 NOTE — Assessment & Plan Note (Signed)
In NSR today ?Continue ASA ?F/b cardiology ?No rate/rhythm control ?

## 2022-01-17 NOTE — Assessment & Plan Note (Signed)
Advised on decr K foods (she had previously increased and does take prunes daily) ?Will recheck today and make further plans ?

## 2022-01-17 NOTE — Patient Instructions (Signed)
Potassium  Potassium affects how steadily your heart beats. If too much potassium builds up in your blood, the potassium can cause an irregular heartbeat or even a heart attack.  You may need to limit or avoid foods that are high in potassium, such as:  Milk and soy milk.  Fruits, such as bananas, apricots, nectarines, melon, prunes, raisins, kiwi, and oranges.  Vegetables, such as potatoes, sweet potatoes, yams, tomatoes, leafy greens, beets, avocado, pumpkin, and winter squash.  White and lima beans.  Whole-wheat breads and pastas.  Beans and nuts.

## 2022-01-17 NOTE — Assessment & Plan Note (Signed)
Reviewed last lipid panel °Not currently on a statin °Recheck FLP and CMP annually °Discussed diet and exercise  °

## 2022-01-17 NOTE — Assessment & Plan Note (Signed)
Stable ?Not on meds ?No exacerbation in >2 yrs ?

## 2022-01-17 NOTE — Assessment & Plan Note (Signed)
Longstanding and stable ?Asymptomatic ?Not on meds  ?Recheck TSH and free T4 ?

## 2022-01-18 LAB — BASIC METABOLIC PANEL
BUN/Creatinine Ratio: 16 (ref 12–28)
BUN: 12 mg/dL (ref 8–27)
CO2: 27 mmol/L (ref 20–29)
Calcium: 10.2 mg/dL (ref 8.7–10.3)
Chloride: 99 mmol/L (ref 96–106)
Creatinine, Ser: 0.75 mg/dL (ref 0.57–1.00)
Glucose: 81 mg/dL (ref 70–99)
Potassium: 5.6 mmol/L — ABNORMAL HIGH (ref 3.5–5.2)
Sodium: 138 mmol/L (ref 134–144)
eGFR: 79 mL/min/{1.73_m2} (ref >59)

## 2022-01-18 LAB — TSH+FREE T4
Free T4: 1.37 ng/dL (ref 0.82–1.77)
TSH: 3.39 u[IU]/mL (ref 0.450–4.500)

## 2022-01-19 ENCOUNTER — Ambulatory Visit: Payer: Self-pay | Admitting: *Deleted

## 2022-01-19 NOTE — Telephone Encounter (Signed)
Pt given lab results per notes of Dr. Brita Romp from 01/19/22 on 01/19/22. Pt verbalized understanding and reports she misunderstood PCP regarding potassium levels and has been eating more bananas and potassium rich foods. Patient verbalized understanding to decrease potassium rich foods. Please advise if patient is to get appt for lab recheck in 1 month. Patient reports she was told to be seen in 6 months . Attempted to clarify with patient. Patient also requesting PCP to advise regarding OTC medication for bowels/ constipation. Patient reports clinic can send text if needed. ?

## 2022-01-20 ENCOUNTER — Telehealth: Payer: Self-pay

## 2022-01-20 DIAGNOSIS — E875 Hyperkalemia: Secondary | ICD-10-CM

## 2022-01-20 NOTE — Telephone Encounter (Signed)
Yes. It seemed that wires were crossed on the potassium.  She needs LESS potassium.  She will still see me in 6 months, but just needs a BMP in 1 month.  For constipation, recommend miralax 1 capful daily. She can titrate up from there as needed for goal of 1 soft BM daily.

## 2022-01-20 NOTE — Telephone Encounter (Signed)
-----   Message from Virginia Crews, MD sent at 01/19/2022  8:13 AM EDT ----- ?Normal/stable labs, except potassium did increase again.  Be sure to follow the diet plan I gave her to decrease the amount of potassium in her diet. Recheck in 1 month. ?

## 2022-01-20 NOTE — Telephone Encounter (Signed)
Patient advised as below.  

## 2022-02-02 DIAGNOSIS — Z86006 Personal history of melanoma in-situ: Secondary | ICD-10-CM | POA: Diagnosis not present

## 2022-02-02 DIAGNOSIS — L538 Other specified erythematous conditions: Secondary | ICD-10-CM | POA: Diagnosis not present

## 2022-02-02 DIAGNOSIS — D2272 Melanocytic nevi of left lower limb, including hip: Secondary | ICD-10-CM | POA: Diagnosis not present

## 2022-02-02 DIAGNOSIS — X32XXXA Exposure to sunlight, initial encounter: Secondary | ICD-10-CM | POA: Diagnosis not present

## 2022-02-02 DIAGNOSIS — Z85828 Personal history of other malignant neoplasm of skin: Secondary | ICD-10-CM | POA: Diagnosis not present

## 2022-02-02 DIAGNOSIS — L820 Inflamed seborrheic keratosis: Secondary | ICD-10-CM | POA: Diagnosis not present

## 2022-02-02 DIAGNOSIS — L821 Other seborrheic keratosis: Secondary | ICD-10-CM | POA: Diagnosis not present

## 2022-02-02 DIAGNOSIS — L570 Actinic keratosis: Secondary | ICD-10-CM | POA: Diagnosis not present

## 2022-02-02 DIAGNOSIS — Z85820 Personal history of malignant melanoma of skin: Secondary | ICD-10-CM | POA: Diagnosis not present

## 2022-02-02 DIAGNOSIS — L298 Other pruritus: Secondary | ICD-10-CM | POA: Diagnosis not present

## 2022-02-02 DIAGNOSIS — D225 Melanocytic nevi of trunk: Secondary | ICD-10-CM | POA: Diagnosis not present

## 2022-02-22 DIAGNOSIS — E875 Hyperkalemia: Secondary | ICD-10-CM | POA: Diagnosis not present

## 2022-02-23 LAB — BASIC METABOLIC PANEL
BUN/Creatinine Ratio: 19 (ref 12–28)
BUN: 15 mg/dL (ref 8–27)
CO2: 23 mmol/L (ref 20–29)
Calcium: 9.9 mg/dL (ref 8.7–10.3)
Chloride: 102 mmol/L (ref 96–106)
Creatinine, Ser: 0.77 mg/dL (ref 0.57–1.00)
Glucose: 96 mg/dL (ref 70–99)
Potassium: 5.3 mmol/L — ABNORMAL HIGH (ref 3.5–5.2)
Sodium: 141 mmol/L (ref 134–144)
eGFR: 76 mL/min/{1.73_m2} (ref >59)

## 2022-02-25 ENCOUNTER — Other Ambulatory Visit: Payer: Self-pay | Admitting: Obstetrics and Gynecology

## 2022-07-19 NOTE — Progress Notes (Signed)
I,Sulibeya S Dimas,acting as a Education administrator for Lavon Paganini, MD.,have documented all relevant documentation on the behalf of Lavon Paganini, MD,as directed by  Lavon Paganini, MD while in the presence of Lavon Paganini, MD.    Annual Wellness Visit     Patient: Carla Sanchez, Female    DOB: 09-08-1936, 86 y.o.   MRN: [id number] Visit Date: 07/20/2022  Today's Provider: Lavon Paganini, MD   Chief Complaint  Patient presents with   Medicare Wellness   Subjective    Aniqa Hare is a 86 y.o. female who presents today for her Annual Wellness Visit. She reports consuming a general diet. Home exercise routine includes walking 2 hrs per week. She generally feels well. She reports sleeping well. She does not have additional problems to discuss today.   HPI   Medications: Outpatient Medications Prior to Visit  Medication Sig   ascorbic acid (VITAMIN C) 500 MG tablet Take 500 mg by mouth daily.   aspirin 81 MG tablet Take 81 mg by mouth daily.   Biotin 10 MG CAPS Take by mouth daily.    Calcium Carb-Cholecalciferol (CALCIUM CARBONATE-VITAMIN D3) 600-400 MG-UNIT TABS Take 1 tablet by mouth daily.   Cyanocobalamin (B-12 PO) Take by mouth.   Multiple Vitamin (MULTIVITAMIN) tablet Take 1 tablet by mouth daily.   pyridoxine (B-6) 100 MG tablet Take 100 mg by mouth daily.   TRIMO-SAN 0.025-0.01 % GEL INSERT 1 APPLICATORFUL IN VAGINA  ONCE A WEEK FILL  TO  1/4  TO  1/2  OF  APPLICATOR   vitamin X-64 (CYANOCOBALAMIN) 1000 MCG tablet Take 1,000 mcg by mouth daily.   No facility-administered medications prior to visit.    Allergies  Allergen Reactions   Celebrex [Celecoxib]     Patient Care Team: Virginia Crews, MD as PCP - General (Family Medicine) Thelma Comp, OD as Consulting Physician (Optometry) Oneta Rack, MD as Consulting Physician (Dermatology) Rubie Maid, MD as Referring Physician (Obstetrics and Gynecology) Isaias Cowman, MD as Consulting Physician (Cardiology)  Review of Systems  All other systems reviewed and are negative.   Last CBC Lab Results  Component Value Date   WBC 5.3 04/29/2020   HGB 13.4 04/29/2020   HCT 41.8 04/29/2020   MCV 89 04/29/2020   MCH 28.6 04/29/2020   RDW 13.1 04/29/2020   PLT 219 68/12/2120   Last metabolic panel Lab Results  Component Value Date   GLUCOSE 96 02/22/2022   NA 141 02/22/2022   K 5.3 (H) 02/22/2022   CL 102 02/22/2022   CO2 23 02/22/2022   BUN 15 02/22/2022   CREATININE 0.77 02/22/2022   EGFR 76 02/22/2022   CALCIUM 9.9 02/22/2022   PROT 6.0 07/19/2021   ALBUMIN 4.2 07/19/2021   LABGLOB 1.8 07/19/2021   AGRATIO 2.3 (H) 07/19/2021   BILITOT 0.4 07/19/2021   ALKPHOS 91 07/19/2021   AST 20 07/19/2021   ALT 11 07/19/2021   ANIONGAP 7 10/13/2015   Last lipids Lab Results  Component Value Date   CHOL 209 (H) 07/19/2021   HDL 65 07/19/2021   LDLCALC 126 (H) 07/19/2021   TRIG 102 07/19/2021   CHOLHDL 3.2 07/19/2021   Last hemoglobin A1c No results found for: "HGBA1C" Last thyroid functions Lab Results  Component Value Date   TSH 3.390 01/17/2022   T4TOTAL 9.1 07/11/2017   Last vitamin D No results found for: "25OHVITD2", "25OHVITD3", "VD25OH" Last vitamin B12 and Folate Lab Results  Component Value Date  [id number] 1,042 07/19/2021        Objective    Vitals: BP 133/83 (BP Location: Left Arm, Patient Position: Sitting, Cuff Size: Normal)   Pulse 64   Temp 98.2 F (36.8 C) (Oral)   Resp 16   Ht '5\' 2"'  (1.575 m)   Wt 135 lb (61.2 kg)   BMI 24.69 kg/m  BP Readings from Last 3 Encounters:  07/20/22 133/83  01/17/22 130/73  12/29/21 (!) 122/53   Wt Readings from Last 3 Encounters:  07/20/22 135 lb (61.2 kg)  01/17/22 138 lb (62.6 kg)  12/29/21 138 lb 11.2 oz (62.9 kg)       Physical Exam Vitals reviewed.  Constitutional:      General: She is not in acute distress.    Appearance: Normal appearance. She is  well-developed. She is not diaphoretic.  HENT:     Head: Normocephalic and atraumatic.     Right Ear: Tympanic membrane, ear canal and external ear normal.     Left Ear: Tympanic membrane, ear canal and external ear normal.     Nose: Nose normal.     Mouth/Throat:     Mouth: Mucous membranes are moist.     Pharynx: Oropharynx is clear. No oropharyngeal exudate.  Eyes:     General: No scleral icterus.    Conjunctiva/sclera: Conjunctivae normal.     Pupils: Pupils are equal, round, and reactive to light.  Neck:     Thyroid: No thyromegaly.  Cardiovascular:     Rate and Rhythm: Normal rate and regular rhythm.     Pulses: Normal pulses.     Heart sounds: Normal heart sounds. No murmur heard. Pulmonary:     Effort: Pulmonary effort is normal. No respiratory distress.     Breath sounds: Normal breath sounds. No wheezing or rales.  Abdominal:     General: There is no distension.     Palpations: Abdomen is soft.     Tenderness: There is no abdominal tenderness.  Musculoskeletal:        General: No deformity.     Cervical back: Neck supple.     Right lower leg: No edema.     Left lower leg: No edema.  Lymphadenopathy:     Cervical: No cervical adenopathy.  Skin:    General: Skin is warm and dry.     Findings: No rash.  Neurological:     Mental Status: She is alert and oriented to person, place, and time. Mental status is at baseline.     Sensory: No sensory deficit.     Motor: No weakness.     Gait: Gait normal.  Psychiatric:        Mood and Affect: Mood normal.        Behavior: Behavior normal.        Thought Content: Thought content normal.      Most recent functional status assessment:    07/20/2022    9:30 AM  In your present state of health, do you have any difficulty performing the following activities:  Hearing? 0  Vision? 0  Difficulty concentrating or making decisions? 1  Walking or climbing stairs? 1  Dressing or bathing? 0  Doing errands, shopping? 0    Most recent fall risk assessment:    07/20/2022    9:29 AM  Fall Risk   Falls in the past year? 0  Number falls in past yr: 0  Injury with Fall? 0  Risk for fall due to : No  Fall Risks  Follow up Falls evaluation completed    Most recent depression screenings:    07/20/2022    9:29 AM 12/29/2021    8:32 AM  PHQ 2/9 Scores  PHQ - 2 Score 0 0  PHQ- 9 Score 0    Most recent cognitive screening:    04/10/2019    9:17 AM  6CIT Screen  What Year? 0 points  What month? 0 points  What time? 0 points  Count back from 20 0 points  Months in reverse 0 points  Repeat phrase 4 points  Total Score 4 points   Most recent Audit-C alcohol use screening    07/20/2022    9:30 AM  Alcohol Use Disorder Test (AUDIT)  1. How often do you have a drink containing alcohol? 0  2. How many drinks containing alcohol do you have on a typical day when you are drinking? 0  3. How often do you have six or more drinks on one occasion? 0  AUDIT-C Score 0   A score of 3 or more in women, and 4 or more in men indicates increased risk for alcohol abuse, EXCEPT if all of the points are from question 1   No results found for any visits on 07/20/22.  Assessment & Plan     Annual wellness visit done today including the all of the following: Reviewed patient's Family Medical History Reviewed and updated list of patient's medical providers Assessment of cognitive impairment was done Assessed patient's functional ability Established a written schedule for health screening [place] Completed and Reviewed  Exercise Activities and Dietary recommendations  Goals      Prevent falls     Recommend to remove any items from the home that may cause slips or trips.        Immunization History  Administered Date(s) Administered   Fluad Quad(high Dose 65+) 06/24/2019, 06/24/2020, 06/14/2021, 07/20/2022   Influenza, High Dose Seasonal PF 07/21/2015, 07/28/2016, 07/11/2017,  08/08/2018   PFIZER(Purple Top)SARS-COV-2 Vaccination 11/03/2019, 11/24/2019, 07/23/2020   Pneumococcal Conjugate-13 08/26/2014   Pneumococcal Polysaccharide-23 08/11/1999, 07/11/2017   Td 05/04/1997   Tdap 08/26/2014   Zoster, Live 02/12/2012    Health Maintenance  Topic Date Due   Zoster Vaccines- Shingrix (1 of 2) Never done   COVID-19 Vaccine (4 - Pfizer risk series) 09/17/2020   DEXA SCAN  06/14/2022   TETANUS/TDAP  08/26/2024   Pneumonia Vaccine 52+ Years old  Completed   INFLUENZA VACCINE  Completed   HPV VACCINES  Aged Out     Discussed health benefits of physical activity, and encouraged her to engage in regular exercise appropriate for her age and condition.    Problem List Items Addressed This Visit       Endocrine   Subclinical hypothyroidism    Recheck TSH and free T4 Not on synthroid      Relevant Orders   TSH + free T4     Nervous and Auditory   Neuropathy    Diagnosed last year Good circulation Consider gabapentin Continue B12 supplement        Musculoskeletal and Integument   Osteopenia    S/p previous fosamax treatment Time to repeat DEXA - she will consider it - not ordered today        Other   Hypercholesterolemia    Reviewed last lipid panel Not currently on a statin Recheck FLP and CMP Discussed diet and exercise       Relevant Orders  Lipid Panel With LDL/HDL Ratio   Hyperkalemia   Relevant Orders   Comprehensive metabolic panel   Other Visit Diagnoses     Encounter for annual wellness visit (AWV) in Medicare patient    -  Primary   Need for immunization against influenza       Relevant Orders   Flu Vaccine QUAD High Dose(Fluad) (Completed)        Return in about 1 year (around 07/21/2023) for AWV.     I, Lavon Paganini, MD, have reviewed all documentation for this visit. The documentation on 07/20/22 for the exam, diagnosis, procedures, and orders are all accurate and complete.   Tarig Zimmers, Dionne Bucy, MD,  [facility]

## 2022-07-20 ENCOUNTER — Encounter: Payer: Self-pay | Admitting: Family Medicine

## 2022-07-20 ENCOUNTER — Ambulatory Visit (INDEPENDENT_AMBULATORY_CARE_PROVIDER_SITE_OTHER): Payer: Medicare Other | Admitting: Family Medicine

## 2022-07-20 VITALS — BP 133/83 | HR 64 | Temp 98.2°F | Resp 16 | Ht 62.0 in | Wt 135.0 lb

## 2022-07-20 DIAGNOSIS — E7800 Pure hypercholesterolemia, unspecified: Secondary | ICD-10-CM

## 2022-07-20 DIAGNOSIS — Z0000 Encounter for general adult medical examination without abnormal findings: Secondary | ICD-10-CM

## 2022-07-20 DIAGNOSIS — E875 Hyperkalemia: Secondary | ICD-10-CM | POA: Diagnosis not present

## 2022-07-20 DIAGNOSIS — M8589 Other specified disorders of bone density and structure, multiple sites: Secondary | ICD-10-CM

## 2022-07-20 DIAGNOSIS — E038 Other specified hypothyroidism: Secondary | ICD-10-CM | POA: Diagnosis not present

## 2022-07-20 DIAGNOSIS — G629 Polyneuropathy, unspecified: Secondary | ICD-10-CM

## 2022-07-20 DIAGNOSIS — Z23 Encounter for immunization: Secondary | ICD-10-CM | POA: Diagnosis not present

## 2022-07-20 NOTE — Assessment & Plan Note (Signed)
S/p previous fosamax treatment Time to repeat DEXA - she will consider it - not ordered today

## 2022-07-20 NOTE — Assessment & Plan Note (Signed)
Recheck TSH and free T4 Not on synthroid

## 2022-07-20 NOTE — Assessment & Plan Note (Signed)
Reviewed last lipid panel Not currently on a statin Recheck FLP and CMP Discussed diet and exercise  

## 2022-07-20 NOTE — Assessment & Plan Note (Signed)
Diagnosed last year Good circulation Consider gabapentin Continue B12 supplement

## 2022-07-21 LAB — LIPID PANEL WITH LDL/HDL RATIO
Cholesterol, Total: 203 mg/dL — ABNORMAL HIGH (ref 100–199)
HDL: 70 mg/dL (ref >39)
LDL Chol Calc (NIH): 120 mg/dL — ABNORMAL HIGH (ref 0–99)
LDL/HDL Ratio: 1.7 ratio (ref 0.0–3.2)
Triglycerides: 74 mg/dL (ref 0–149)
VLDL Cholesterol Cal: 13 mg/dL (ref 5–40)

## 2022-07-21 LAB — COMPREHENSIVE METABOLIC PANEL
ALT: 10 IU/L (ref 0–32)
AST: 18 IU/L (ref 0–40)
Albumin/Globulin Ratio: 2 (ref 1.2–2.2)
Albumin: 4.3 g/dL (ref 3.7–4.7)
Alkaline Phosphatase: 129 IU/L — ABNORMAL HIGH (ref 44–121)
BUN/Creatinine Ratio: 13 (ref 12–28)
BUN: 9 mg/dL (ref 8–27)
Bilirubin Total: 0.4 mg/dL (ref 0.0–1.2)
CO2: 24 mmol/L (ref 20–29)
Calcium: 10.2 mg/dL (ref 8.7–10.3)
Chloride: 95 mmol/L — ABNORMAL LOW (ref 96–106)
Creatinine, Ser: 0.71 mg/dL (ref 0.57–1.00)
Globulin, Total: 2.2 g/dL (ref 1.5–4.5)
Glucose: 91 mg/dL (ref 70–99)
Potassium: 5.2 mmol/L (ref 3.5–5.2)
Sodium: 134 mmol/L (ref 134–144)
Total Protein: 6.5 g/dL (ref 6.0–8.5)
eGFR: 84 mL/min/{1.73_m2} (ref >59)

## 2022-07-21 LAB — TSH+FREE T4
Free T4: 1.44 ng/dL (ref 0.82–1.77)
TSH: 4.02 u[IU]/mL (ref 0.450–4.500)

## 2022-10-10 ENCOUNTER — Telehealth: Payer: Self-pay

## 2022-10-10 DIAGNOSIS — Z4689 Encounter for fitting and adjustment of other specified devices: Secondary | ICD-10-CM

## 2022-10-10 DIAGNOSIS — N952 Postmenopausal atrophic vaginitis: Secondary | ICD-10-CM

## 2022-10-10 DIAGNOSIS — N816 Rectocele: Secondary | ICD-10-CM

## 2022-10-10 MED ORDER — TRIMO-SAN 0.025-0.01 % VA GEL: VAGINAL | 0 refills | Status: DC

## 2022-10-10 NOTE — Telephone Encounter (Signed)
Pt called for refill on her vaginal cream. Refill sent

## 2022-10-11 ENCOUNTER — Ambulatory Visit: Payer: Self-pay | Admitting: *Deleted

## 2022-10-11 MED ORDER — BENZONATATE 100 MG PO CAPS: 100.0000 mg | ORAL_CAPSULE | Freq: Two times a day (BID) | ORAL | 0 refills | Status: AC | PRN

## 2022-10-11 NOTE — Telephone Encounter (Signed)
Will prescribe Tessalon Perles to use up to 3 times daily as needed for cough symptoms.  No meantime, recommend using lozenges, drinking warm teas with honey if tolerable.  Do recommend inpatient visit if patient develops shortness of breath, difficulty breathing, lightheadedness or dizziness.  Would recommend home COVID test if available.  Recommend frequent handwashing and disinfecting frequently touched surfaces.  I will also recommend patient and close contacts wear mask.    Eulis Foster, MD [facility]

## 2022-10-11 NOTE — Telephone Encounter (Signed)
Summary: Cough   Patient states that she has had a cough for 5 days, cough is productive. Patient wants advice on what to take for symptoms.         Chief Complaint: Cough Symptoms: Productive cough "Greenish" Had sore throat Thursday, not presently, voice hoarse, "Head full of gook." Frequency: 6 days Pertinent Negatives: Patient denies fever, SOB Disposition: '[]'$ ED /'[]'$ Urgent Care (no appt availability in office) / '[]'$ Appointment(In office/virtual)/ '[]'$  [facility]/ '[]'$ Home Care/ '[]'$ Refused Recommended Disposition /'[]'$ [facility]/ '[x]'$  Follow-up with PCP Additional Notes: Pt declines appt. States they have moved "Much further away from clinic."  Declines virtual/tele visit.  Pt requesting "Something be called in." Advised would need appt. Did provide home care advise and assured pt NT would route to practice for PCPs review and final disposition. Please advise  Reason for Disposition  Cough  Answer Assessment - Initial Assessment Questions 1. ONSET: "When did the cough begin?"      Thursday night 2. SEVERITY: "How bad is the cough today?"      Bad spells during day, weekend. Better today 3. SPUTUM: "Describe the color of your sputum" (none, dry cough; clear, white, yellow, green)     greenish 4. HEMOPTYSIS: "Are you coughing up any blood?" If so ask: "How much?" (flecks, streaks, tablespoons, etc.)     No 5. DIFFICULTY BREATHING: "Are you having difficulty breathing?" If Yes, ask: "How bad is it?" (e.g., mild, moderate, severe)    - MILD: No SOB at rest, mild SOB with walking, speaks normally in sentences, can lie down, no retractions, pulse < 100.    - MODERATE: SOB at rest, SOB with minimal exertion and prefers to sit, cannot lie down flat, speaks in phrases, mild retractions, audible wheezing, pulse 100-120.    - SEVERE: Very SOB at rest, speaks in single words, struggling to breathe, sitting hunched forward, retractions, pulse > 120      no 6. FEVER: "Do you have  a fever?" If Yes, ask: "What is your temperature, how was it measured, and when did it start?"     No 10. OTHER SYMPTOMS: "Do you have any other symptoms?" (e.g., runny nose, wheezing, chest pain)       Throat sore Thursday, not presently. Voice hoarse. Sinus "[place]"  Protocols used: Cough - Acute Productive-A-AH

## 2022-10-12 NOTE — Telephone Encounter (Signed)
Called pt and made aware - per pt she will call and have RX transferred to [facility] as she isn't in [place] right now.

## 2022-12-29 ENCOUNTER — Encounter: Payer: Medicare Other | Admitting: Obstetrics and Gynecology

## 2022-12-29 NOTE — Progress Notes (Unsigned)
    GYNECOLOGY PROGRESS NOTE  Subjective:    Patient ID: Carla Sanchez, female    DOB: 1936/04/04, 87 y.o.   MRN: [id number]  HPI  Patient is a 87 y.o. G57P4 female who presents for 1 year follow up pessary maintenance. She denies major complaints today. She reports no vaginal bleeding. She still notes some occasional discharge but is not bothersome. She denies pelvic discomfort and difficulty urinating or moving her bowels. Self-maintains the pessary at home, cleans it approximately twice weekly. Uses Trimo-san gel once weekly.   The following portions of the patient's history were reviewed and updated as appropriate: allergies, current medications, past family history, past medical history, past social history, past surgical history, and problem list.  She  has a past medical history of Arthritis, BCC (basal cell carcinoma) (04/18/2018), Cancer ([place]), Endometriosis, Hemorrhoids, Migraines, and SCCA (squamous cell carcinoma) of skin (04/04/2018).   She  has a past surgical history that includes Hemorrhoidectomy with hemorrhoid banding; Joint replacement; Bladder Tack; Breast surgery; Colonoscopy with propofol (N/A, 03/29/2015); Lung surgery; Hand surgery (Right, 04/2015); Abdominal hysterectomy; and bladder sugery (2008).   Her family history includes Arthritis in her mother and another family member; Breast cancer in her sister; Cancer in her brother, niece, sister, and sister; Epilepsy in an other family member; Heart attack in her father and another family member; Heart disease (age of onset: 64) in her brother; Heart failure in her mother; Hypertension in her father; Kidney disease in her sister; Myasthenia gravis in an other family member; Prostate cancer in an other family member; Seizures in her brother and sister; Thyroid cancer in an other family member.   She  reports that she has never smoked. She has never used smokeless tobacco. She reports that she does not currently use alcohol.  She reports that she does not use drugs.   She has a current medication list which includes the following prescription(s): ascorbic acid, aspirin, biotin, calcium carbonate-vitamin d3, cyanocobalamin, multivitamin, trimo-san, pyridoxine, and vitamin b-12.   She is allergic to celebrex [celecoxib].  Review of Systems Pertinent items are noted in HPI.   Objective:   There were no vitals taken for this visit. There is no height or weight on file to calculate BMI. General appearance: alert, cooperative, and no distress Abdomen: {abdominal exam:16834} Pelvic: {pelvic exam:16852::"cervix normal in appearance","external genitalia normal","no adnexal masses or tenderness","no cervical motion tenderness","rectovaginal septum normal","uterus normal size, shape, and consistency","vagina normal without discharge"} Extremities: {extremity exam:5109} Neurologic: {neuro exam:17854}   Assessment:   1. Pessary maintenance   2. Rectocele   3. Vaginal atrophy   4. Cystocele, midline      Plan:   There are no diagnoses linked to this encounter.    Rubie Maid, MD [place] [facility]

## 2023-01-02 ENCOUNTER — Ambulatory Visit (INDEPENDENT_AMBULATORY_CARE_PROVIDER_SITE_OTHER): Payer: Medicare Other | Admitting: Obstetrics and Gynecology

## 2023-01-02 ENCOUNTER — Encounter: Payer: Self-pay | Admitting: Obstetrics and Gynecology

## 2023-01-02 VITALS — BP 112/62 | HR 66 | Resp 16 | Ht 63.0 in | Wt 136.0 lb

## 2023-01-02 DIAGNOSIS — N8110 Cystocele, unspecified: Secondary | ICD-10-CM

## 2023-01-02 DIAGNOSIS — Z4689 Encounter for fitting and adjustment of other specified devices: Secondary | ICD-10-CM | POA: Diagnosis not present

## 2023-01-02 DIAGNOSIS — N952 Postmenopausal atrophic vaginitis: Secondary | ICD-10-CM | POA: Diagnosis not present

## 2023-01-02 DIAGNOSIS — N816 Rectocele: Secondary | ICD-10-CM | POA: Diagnosis not present

## 2023-01-02 DIAGNOSIS — N8111 Cystocele, midline: Secondary | ICD-10-CM

## 2023-01-02 MED ORDER — TRIMO-SAN 0.025-0.01 % VA GEL: VAGINAL | 1 refills | Status: AC

## 2023-02-08 DIAGNOSIS — D2262 Melanocytic nevi of left upper limb, including shoulder: Secondary | ICD-10-CM | POA: Diagnosis not present

## 2023-02-08 DIAGNOSIS — D2272 Melanocytic nevi of left lower limb, including hip: Secondary | ICD-10-CM | POA: Diagnosis not present

## 2023-02-08 DIAGNOSIS — D0462 Carcinoma in situ of skin of left upper limb, including shoulder: Secondary | ICD-10-CM | POA: Diagnosis not present

## 2023-02-08 DIAGNOSIS — L570 Actinic keratosis: Secondary | ICD-10-CM | POA: Diagnosis not present

## 2023-02-08 DIAGNOSIS — D2271 Melanocytic nevi of right lower limb, including hip: Secondary | ICD-10-CM | POA: Diagnosis not present

## 2023-02-08 DIAGNOSIS — Z86006 Personal history of melanoma in-situ: Secondary | ICD-10-CM | POA: Diagnosis not present

## 2023-02-08 DIAGNOSIS — Z08 Encounter for follow-up examination after completed treatment for malignant neoplasm: Secondary | ICD-10-CM | POA: Diagnosis not present

## 2023-02-08 DIAGNOSIS — D225 Melanocytic nevi of trunk: Secondary | ICD-10-CM | POA: Diagnosis not present

## 2023-02-08 DIAGNOSIS — L821 Other seborrheic keratosis: Secondary | ICD-10-CM | POA: Diagnosis not present

## 2023-02-08 DIAGNOSIS — C4401 Basal cell carcinoma of skin of lip: Secondary | ICD-10-CM | POA: Diagnosis not present

## 2023-02-08 DIAGNOSIS — D485 Neoplasm of uncertain behavior of skin: Secondary | ICD-10-CM | POA: Diagnosis not present

## 2023-02-08 DIAGNOSIS — D2261 Melanocytic nevi of right upper limb, including shoulder: Secondary | ICD-10-CM | POA: Diagnosis not present

## 2023-02-22 DIAGNOSIS — D0462 Carcinoma in situ of skin of left upper limb, including shoulder: Secondary | ICD-10-CM | POA: Diagnosis not present

## 2023-03-08 DIAGNOSIS — L988 Other specified disorders of the skin and subcutaneous tissue: Secondary | ICD-10-CM | POA: Diagnosis not present

## 2023-03-08 DIAGNOSIS — L814 Other melanin hyperpigmentation: Secondary | ICD-10-CM | POA: Diagnosis not present

## 2023-03-08 DIAGNOSIS — C4401 Basal cell carcinoma of skin of lip: Secondary | ICD-10-CM | POA: Diagnosis not present

## 2023-03-08 DIAGNOSIS — L578 Other skin changes due to chronic exposure to nonionizing radiation: Secondary | ICD-10-CM | POA: Diagnosis not present

## 2023-03-12 DIAGNOSIS — I341 Nonrheumatic mitral (valve) prolapse: Secondary | ICD-10-CM | POA: Diagnosis not present

## 2023-04-02 DIAGNOSIS — H0288B Meibomian gland dysfunction left eye, upper and lower eyelids: Secondary | ICD-10-CM | POA: Diagnosis not present

## 2023-04-02 DIAGNOSIS — H52223 Regular astigmatism, bilateral: Secondary | ICD-10-CM | POA: Diagnosis not present

## 2023-04-02 DIAGNOSIS — H0288A Meibomian gland dysfunction right eye, upper and lower eyelids: Secondary | ICD-10-CM | POA: Diagnosis not present

## 2023-04-02 DIAGNOSIS — Z9841 Cataract extraction status, right eye: Secondary | ICD-10-CM | POA: Diagnosis not present

## 2023-04-02 DIAGNOSIS — Z9842 Cataract extraction status, left eye: Secondary | ICD-10-CM | POA: Diagnosis not present

## 2023-06-18 DIAGNOSIS — Z23 Encounter for immunization: Secondary | ICD-10-CM | POA: Diagnosis not present

## 2023-07-23 ENCOUNTER — Ambulatory Visit (INDEPENDENT_AMBULATORY_CARE_PROVIDER_SITE_OTHER): Payer: Medicare Other

## 2023-07-23 VITALS — Ht 63.0 in | Wt 136.0 lb

## 2023-07-23 DIAGNOSIS — Z0000 Encounter for general adult medical examination without abnormal findings: Secondary | ICD-10-CM

## 2023-07-23 NOTE — Patient Instructions (Signed)
Carla Sanchez , Thank you for taking time to come for your Medicare Wellness Visit. I appreciate your ongoing commitment to your health goals. Please review the following plan we discussed and let me know if I can assist you in the future.   Referrals/Orders/Follow-Ups/Clinician Recommendations: none  This is a list of the screening recommended for you and due dates:  Health Maintenance  Topic Date Due   Zoster (Shingles) Vaccine (1 of 2) [birth year]/1957   DEXA scan (bone density measurement)  06/14/2022   Flu Shot  05/10/2023   COVID-19 Vaccine (4 - 2023-24 season) 06/10/2023   Medicare Annual Wellness Visit  07/22/2024   DTaP/Tdap/Td vaccine (3 - Td or Tdap) 08/26/2024   Pneumonia Vaccine  Completed   HPV Vaccine  Aged Out    Advanced directives: (Copy Requested) Please bring a copy of your health care power of attorney and living will to the office to be added to your chart at your convenience.  Next Medicare Annual Wellness Visit scheduled for next year: Yes 07/28/24 @ 9:25am telephone

## 2023-07-23 NOTE — Progress Notes (Signed)
Subjective:   Carla Sanchez is a 87 y.o. female who presents for Medicare Annual (Subsequent) preventive examination.  Visit Complete: Virtual I connected with  [name] on 07/23/23 by a audio enabled telemedicine application and verified that I am speaking with the correct person using two identifiers.  Patient Location: Home  Provider Location: Office/Clinic  I discussed the limitations of evaluation and management by telemedicine. The patient expressed understanding and agreed to proceed.  Vital Signs: Because this visit was a virtual/telehealth visit, some criteria may be missing or patient reported. Any vitals not documented were not able to be obtained and vitals that have been documented are patient reported.  Patient Medicare AWV questionnaire was completed by the patient on (not done); I have confirmed that all information answered by patient is correct and no changes since this date. Cardiac Risk Factors include: advanced age (>20men, >12 women)    Objective:    Today's Vitals   07/23/23 0926  Weight: 136 lb (61.7 kg)  Height: 5\' 3"  (1.6 m)   Body mass index is 24.09 kg/m.     07/23/2023    9:38 AM 04/29/2020    8:23 AM 04/10/2019    9:09 AM 04/08/2018    9:09 AM 04/06/2017   10:18 AM 03/20/2016    2:11 PM 10/13/2015    1:54 PM  Advanced Directives  Does Patient Have a Medical Advance Directive? Yes Yes Yes Yes Yes Yes Yes  Type of Estate agent of [place];Living will Living will Healthcare Power of [place];Living will Healthcare Power of [place];Living will Living will;Healthcare Power of [employer] Power of [place];Living will Living will;Healthcare Power of Attorney  Copy of Healthcare Power of Attorney in Chart? No - copy requested  Yes - validated most recent copy scanned in chart (See row information) No - copy requested No - copy requested      Current Medications (verified) Outpatient Encounter Medications as  of 07/23/2023  Medication Sig   ascorbic acid (VITAMIN C) 500 MG tablet Take 500 mg by mouth daily.   aspirin 81 MG tablet Take 81 mg by mouth daily.   Biotin 10 MG CAPS Take by mouth daily.    Calcium Carb-Cholecalciferol (CALCIUM CARBONATE-VITAMIN D3) 600-400 MG-UNIT TABS Take 1 tablet by mouth daily.   Cyanocobalamin (B-12 PO) Take by mouth.   Multiple Vitamin (MULTIVITAMIN) tablet Take 1 tablet by mouth daily.   Oxyquinoline-Sod Lauryl Sulf (TRIMO-SAN) 0.025-0.01 % GEL INSERT 1 APPLICATORFUL IN VAGINA  ONCE A WEEK FILL  TO  1/4  TO  1/2  OF  APPLICATOR   pyridoxine (B-6) 100 MG tablet Take 100 mg by mouth daily.   vitamin B-12 (CYANOCOBALAMIN) 1000 MCG tablet Take 1,000 mcg by mouth daily.   benzonatate (TESSALON) 100 MG capsule Take 1 capsule (100 mg total) by mouth 2 (two) times daily as needed for cough. (Patient not taking: Reported on 07/23/2023)   No facility-administered encounter medications on file as of 07/23/2023.    Allergies (verified) Celebrex [celecoxib]   History: Past Medical History:  Diagnosis Date   Arthritis    BCC (basal cell carcinoma) 04/18/2018   Cancer (HCC)    Malignant Melanoma   Endometriosis    Hemorrhoids    Migraines    SCCA (squamous cell carcinoma) of skin 04/04/2018   removed from left shoulder   Past Surgical History:  Procedure Laterality Date   ABDOMINAL HYSTERECTOMY     in her 60's   bladder sugery  2008   sling-  dr Virl Diamond   Bladder Tack     BREAST SURGERY     Bilateral Mastectomy   COLONOSCOPY WITH PROPOFOL N/A 03/29/2015   Procedure: COLONOSCOPY WITH PROPOFOL;  Surgeon: Christena Deem, MD;  Location: [facility] ENDOSCOPY;  Service: Endoscopy;  Laterality: N/A;   HAND SURGERY Right 04/2015   HEMORRHOIDECTOMY WITH HEMORRHOID BANDING     JOINT REPLACEMENT     RT TKR; LT TKR; RT Total Hip Arthroplasty; LT Hip Arthroplasty   LUNG SURGERY     at age 71, also removed a rib   Family History  Problem Relation Age of Onset   Heart  failure Mother    Arthritis Mother    Hypertension Father    Heart attack Father    Seizures Brother    Seizures Sister    Kidney disease Sister    Cancer Sister        lymphoma   Cancer Sister    Breast cancer Sister    Cancer Brother        prostate   Heart disease Brother 50       heart attack   Arthritis Other    Heart attack Other    Epilepsy Other    Thyroid cancer Other    Myasthenia gravis Other    Prostate cancer Other    Cancer Niece        breast cancer   Social History   Socioeconomic History   Marital status: Married    Spouse name: Not on file   Number of children: 4   Years of education: Not on file   Highest education level: 12th grade  Occupational History   Not on file  Tobacco Use   Smoking status: Never   Smokeless tobacco: Never  Vaping Use   Vaping status: Never Used  Substance and Sexual Activity   Alcohol use: Not Currently    Comment: 1-2 drinks rare   Drug use: No   Sexual activity: Yes    Birth control/protection: Surgical  Other Topics Concern   Not on file  Social History Narrative   Not on file   Social Determinants of Health   Financial Resource Strain: Low Risk  (07/23/2023)   Overall Financial Resource Strain (CARDIA)    Difficulty of Paying Living Expenses: Not hard at all  Food Insecurity: No Food Insecurity (07/23/2023)   Hunger Vital Sign    Worried About Running Out of Food in the Last Year: Never true    Ran Out of Food in the Last Year: Never true  Transportation Needs: No Transportation Needs (07/23/2023)   PRAPARE - Administrator, Civil Service (Medical): No    Lack of Transportation (Non-Medical): No  Physical Activity: Insufficiently Active (07/23/2023)   Exercise Vital Sign    Days of Exercise per Week: 3 days    Minutes of Exercise per Session: 20 min  Stress: No Stress Concern Present (07/23/2023)   [employer] of Occupational Health - Occupational Stress Questionnaire    Feeling of  Stress : Not at all  Social Connections: Socially Integrated (07/23/2023)   Social Connection and Isolation Panel [NHANES]    Frequency of Communication with Friends and Family: More than three times a week    Frequency of Social Gatherings with Friends and Family: More than three times a week    Attends Religious Services: 1 to 4 times per year    Active Member of [employer] or Organizations: Yes  Attends Engineer, structural: More than 4 times per year    Marital Status: Married    Tobacco Counseling Counseling given: Not Answered   Clinical Intake:  Pre-visit preparation completed: No  Pain : No/denies pain     BMI - recorded: 24.09 Nutritional Status: BMI of 19-24  Normal Nutritional Risks: None Diabetes: No  How often do you need to have someone help you when you read instructions, pamphlets, or other written materials from your doctor or pharmacy?: 1 - Never  Interpreter Needed?: No  Comments: lives with her son Information entered by :: B.Aleczander Fandino,LPN   Activities of Daily Living    07/23/2023    9:38 AM  In your present state of health, do you have any difficulty performing the following activities:  Hearing? 1  Vision? 0  Difficulty concentrating or making decisions? 1  Walking or climbing stairs? 0  Dressing or bathing? 1  Doing errands, shopping? 1  Preparing Food and eating ? N  Using the Toilet? N  In the past six months, have you accidently leaked urine? N  Do you have problems with loss of bowel control? N  Managing your Medications? N  Managing your Finances? N  Housekeeping or managing your Housekeeping? N    Patient Care Team: Erasmo Downer, MD as PCP - General (Family Medicine) Blair Promise, OD as Consulting Physician (Optometry) Debbrah Alar, MD as Consulting Physician (Dermatology) Hildred Laser, MD as Referring Physician (Obstetrics and Gynecology) Marcina Millard, MD as Consulting Physician  (Cardiology)  Indicate any recent Medical Services you may have received from other than [facility] providers in the past year (date may be approximate).     Assessment:   This is a routine wellness examination for [place].  Hearing/Vision screen Hearing Screening - Comments:: Pt says she has two hearing aids Vision Screening - Comments:: Pt says her vision is good with glasses [facility]   Goals Addressed             This Visit's Progress    Prevent falls   On track    Recommend to remove any items from the home that may cause slips or trips.       Depression Screen    07/23/2023    9:36 AM 07/20/2022    9:29 AM 12/29/2021    8:32 AM 07/19/2021    8:24 AM 04/29/2020    8:20 AM 04/10/2019    9:09 AM 04/08/2018    9:10 AM  PHQ 2/9 Scores  PHQ - 2 Score 0 0 0 0 0 0 0  PHQ- 9 Score  0  0       Fall Risk    07/23/2023    9:30 AM 01/02/2023    9:48 AM 07/20/2022    9:29 AM 12/29/2021    8:32 AM 07/19/2021    8:24 AM  Fall Risk   Falls in the past year? 0 0 0 0 1  Number falls in past yr: 0 0 0 0 0  Injury with Fall? 0 0 0 0 0  Risk for fall due to : No Fall Risks No Fall Risks No Fall Risks  History of fall(s)  Follow up Falls prevention discussed;Follow up appointment Falls evaluation completed Falls evaluation completed  Falls evaluation completed;Education provided    MEDICARE RISK AT HOME: Medicare Risk at Home Any stairs in or around the home?: Yes (has ramp) If so, are there any without handrails?: Yes Home free of loose  throw rugs in walkways, pet beds, electrical cords, etc?: Yes Adequate lighting in your home to reduce risk of falls?: Yes Life alert?: No Use of a cane, walker or w/c?: No Grab bars in the bathroom?: Yes Shower chair or bench in shower?: No Elevated toilet seat or a handicapped toilet?: No  TIMED UP AND GO:  Was the test performed?  No    Cognitive Function:        07/23/2023    9:39 AM 04/10/2019    9:17 AM 04/06/2017    10:22 AM  6CIT Screen  What Year? 0 points 0 points 0 points  What month? 0 points 0 points 0 points  What time? 0 points 0 points 0 points  Count back from 20 0 points 0 points 0 points  Months in reverse 0 points 0 points 0 points  Repeat phrase 0 points 4 points 2 points  Total Score 0 points 4 points 2 points    Immunizations Immunization History  Administered Date(s) Administered   Fluad Quad(high Dose 65+) 06/24/2019, 06/24/2020, 06/14/2021, 07/20/2022   Influenza, High Dose Seasonal PF 07/21/2015, 07/28/2016, 07/11/2017, 08/08/2018   PFIZER(Purple Top)SARS-COV-2 Vaccination 11/03/2019, 11/24/2019, 07/23/2020   Pneumococcal Conjugate-13 08/26/2014   Pneumococcal Polysaccharide-23 08/11/1999, 07/11/2017   Td 05/04/1997   Tdap 08/26/2014   Zoster, Live 02/12/2012    TDAP status: Up to date  Flu Vaccine status: Due, Education has been provided regarding the importance of this vaccine. Advised may receive this vaccine at local pharmacy or Health Dept. Aware to provide a copy of the vaccination record if obtained from local pharmacy or Health Dept. Verbalized acceptance and understanding.  Pneumococcal vaccine status: Up to date  Covid-19 vaccine status: Completed vaccines  Qualifies for Shingles Vaccine? Yes   Zostavax completed No   Shingrix Completed?: No.    Education has been provided regarding the importance of this vaccine. Patient has been advised to call insurance company to determine out of pocket expense if they have not yet received this vaccine. Advised may also receive vaccine at local pharmacy or Health Dept. Verbalized acceptance and understanding.  Screening Tests Health Maintenance  Topic Date Due   Zoster Vaccines- Shingrix (1 of 2) 06/04/1955   DEXA SCAN  06/14/2022   INFLUENZA VACCINE  05/10/2023   COVID-19 Vaccine (4 - 2023-24 season) 06/10/2023   Medicare Annual Wellness (AWV)  07/22/2024   DTaP/Tdap/Td (3 - Td or Tdap) 08/26/2024   Pneumonia  Vaccine 59+ Years old  Completed   HPV VACCINES  Aged Out    Health Maintenance  Health Maintenance Due  Topic Date Due   Zoster Vaccines- Shingrix (1 of 2) 06/04/1955   DEXA SCAN  06/14/2022   INFLUENZA VACCINE  05/10/2023   COVID-19 Vaccine (4 - 2023-24 season) 06/10/2023    Colorectal cancer screening: No longer required.   Mammogram status: No longer required due to age.  Lung Cancer Screening: (Low Dose CT Chest recommended if Age 27-80 years, 20 pack-year currently smoking OR have quit w/in 15years.) does not qualify.   Lung Cancer Screening Referral: no  Additional Screening:  Hepatitis C Screening: does not qualify; Completed no  Vision Screening: Recommended annual ophthalmology exams for early detection of glaucoma and other disorders of the eye. Is the patient up to date with their annual eye exam?  Yes  Who is the provider or what is the name of the office in which the patient attends annual eye exams? [facility] If pt is not established  with a provider, would they like to be referred to a provider to establish care? No .   Dental Screening: Recommended annual dental exams for proper oral hygiene  Diabetic Foot Exam: n/a  Community Resource Referral / Chronic Care Management: CRR required this visit?  No   CCM required this visit?  No    Plan:     I have personally reviewed and noted the following in the patient's chart:   Medical and social history Use of alcohol, tobacco or illicit drugs  Current medications and supplements including opioid prescriptions. Patient is not currently taking opioid prescriptions. Functional ability and status Nutritional status Physical activity Advanced directives List of other physicians Hospitalizations, surgeries, and ER visits in previous 12 months Vitals Screenings to include cognitive, depression, and falls Referrals and appointments  In addition, I have reviewed and discussed with patient certain  preventive protocols, quality metrics, and best practice recommendations. A written personalized care plan for preventive services as well as general preventive health recommendations were provided to patient.    Sue Lush, LPN   [id number]   After Visit Summary: (Declined) Due to this being a telephonic visit, with patients personalized plan was offered to patient but patient Declined AVS at this time   Nurse Notes: Pt is going well. She relays her and her husband lives with her son. She has no concerns or questions at this time.

## 2023-10-24 ENCOUNTER — Telehealth: Payer: Self-pay

## 2023-10-24 DIAGNOSIS — H9190 Unspecified hearing loss, unspecified ear: Secondary | ICD-10-CM

## 2023-10-24 NOTE — Telephone Encounter (Signed)
Copied from CRM [id number]. Topic: General - Other >> Oct 23, 2023  4:10 PM [place] B wrote: Reason for CRM: pt called in asking if Dr B can possibly get referral for ear Dr [facility] for her hearing aids, since she has moved to [place]. I let her know can't make any guarantees without an appt and if this can be video since its for her ears

## 2023-10-25 DIAGNOSIS — L821 Other seborrheic keratosis: Secondary | ICD-10-CM | POA: Diagnosis not present

## 2023-10-25 DIAGNOSIS — L570 Actinic keratosis: Secondary | ICD-10-CM | POA: Diagnosis not present

## 2023-10-25 DIAGNOSIS — Z85820 Personal history of malignant melanoma of skin: Secondary | ICD-10-CM | POA: Diagnosis not present

## 2023-10-25 DIAGNOSIS — D2271 Melanocytic nevi of right lower limb, including hip: Secondary | ICD-10-CM | POA: Diagnosis not present

## 2023-10-25 DIAGNOSIS — D485 Neoplasm of uncertain behavior of skin: Secondary | ICD-10-CM | POA: Diagnosis not present

## 2023-10-25 DIAGNOSIS — D0362 Melanoma in situ of left upper limb, including shoulder: Secondary | ICD-10-CM | POA: Diagnosis not present

## 2023-10-25 DIAGNOSIS — D2272 Melanocytic nevi of left lower limb, including hip: Secondary | ICD-10-CM | POA: Diagnosis not present

## 2023-10-25 DIAGNOSIS — D2262 Melanocytic nevi of left upper limb, including shoulder: Secondary | ICD-10-CM | POA: Diagnosis not present

## 2023-10-25 DIAGNOSIS — D225 Melanocytic nevi of trunk: Secondary | ICD-10-CM | POA: Diagnosis not present

## 2023-10-25 DIAGNOSIS — Z85828 Personal history of other malignant neoplasm of skin: Secondary | ICD-10-CM | POA: Diagnosis not present

## 2023-10-25 DIAGNOSIS — L259 Unspecified contact dermatitis, unspecified cause: Secondary | ICD-10-CM | POA: Diagnosis not present

## 2023-10-25 DIAGNOSIS — D2261 Melanocytic nevi of right upper limb, including shoulder: Secondary | ICD-10-CM | POA: Diagnosis not present

## 2023-10-25 NOTE — Telephone Encounter (Signed)
Ok to send referral as requested for hearing loss. Just put [place] in the comments.

## 2023-10-25 NOTE — Telephone Encounter (Signed)
Referral placed Pt advised 

## 2023-10-31 ENCOUNTER — Telehealth: Payer: Self-pay

## 2023-10-31 NOTE — Telephone Encounter (Signed)
Copied from CRM [id number]. Topic: Referral - Status >> Oct 31, 2023 10:47 AM Tiffany B wrote: Reason for CRM:  Patient states she was transferred to ENT to schedule appointment but the specialist states no referral was received.

## 2023-10-31 NOTE — Telephone Encounter (Signed)
Patient was given phone number to call and schedule appointment.

## 2023-12-13 DIAGNOSIS — D0362 Melanoma in situ of left upper limb, including shoulder: Secondary | ICD-10-CM | POA: Diagnosis not present

## 2024-03-10 DIAGNOSIS — E7800 Pure hypercholesterolemia, unspecified: Secondary | ICD-10-CM | POA: Diagnosis not present

## 2024-03-10 DIAGNOSIS — R002 Palpitations: Secondary | ICD-10-CM | POA: Diagnosis not present

## 2024-03-10 DIAGNOSIS — I493 Ventricular premature depolarization: Secondary | ICD-10-CM | POA: Diagnosis not present

## 2024-03-10 DIAGNOSIS — R0602 Shortness of breath: Secondary | ICD-10-CM | POA: Diagnosis not present

## 2024-03-10 DIAGNOSIS — I341 Nonrheumatic mitral (valve) prolapse: Secondary | ICD-10-CM | POA: Diagnosis not present

## 2024-04-21 DIAGNOSIS — Z9841 Cataract extraction status, right eye: Secondary | ICD-10-CM | POA: Diagnosis not present

## 2024-04-21 DIAGNOSIS — H0288A Meibomian gland dysfunction right eye, upper and lower eyelids: Secondary | ICD-10-CM | POA: Diagnosis not present

## 2024-04-21 DIAGNOSIS — H0288B Meibomian gland dysfunction left eye, upper and lower eyelids: Secondary | ICD-10-CM | POA: Diagnosis not present

## 2024-04-21 DIAGNOSIS — Z9842 Cataract extraction status, left eye: Secondary | ICD-10-CM | POA: Diagnosis not present

## 2024-04-21 DIAGNOSIS — H52223 Regular astigmatism, bilateral: Secondary | ICD-10-CM | POA: Diagnosis not present

## 2024-06-05 DIAGNOSIS — Z96652 Presence of left artificial knee joint: Secondary | ICD-10-CM | POA: Diagnosis not present

## 2024-06-05 DIAGNOSIS — E7800 Pure hypercholesterolemia, unspecified: Secondary | ICD-10-CM | POA: Diagnosis not present

## 2024-06-05 DIAGNOSIS — Z90710 Acquired absence of both cervix and uterus: Secondary | ICD-10-CM | POA: Diagnosis not present

## 2024-06-05 DIAGNOSIS — Z974 Presence of external hearing-aid: Secondary | ICD-10-CM | POA: Diagnosis not present

## 2024-06-05 DIAGNOSIS — H6123 Impacted cerumen, bilateral: Secondary | ICD-10-CM | POA: Diagnosis not present

## 2024-06-05 DIAGNOSIS — Z96649 Presence of unspecified artificial hip joint: Secondary | ICD-10-CM | POA: Diagnosis not present

## 2024-06-05 DIAGNOSIS — H9202 Otalgia, left ear: Secondary | ICD-10-CM | POA: Diagnosis not present

## 2024-06-05 DIAGNOSIS — Z79899 Other long term (current) drug therapy: Secondary | ICD-10-CM | POA: Diagnosis not present

## 2024-06-05 DIAGNOSIS — Z6824 Body mass index (BMI) 24.0-24.9, adult: Secondary | ICD-10-CM | POA: Diagnosis not present

## 2024-06-05 DIAGNOSIS — Z96651 Presence of right artificial knee joint: Secondary | ICD-10-CM | POA: Diagnosis not present

## 2024-06-05 DIAGNOSIS — Z85820 Personal history of malignant melanoma of skin: Secondary | ICD-10-CM | POA: Diagnosis not present

## 2024-06-08 DIAGNOSIS — E7800 Pure hypercholesterolemia, unspecified: Secondary | ICD-10-CM | POA: Diagnosis not present

## 2024-06-10 DIAGNOSIS — H60332 Swimmer's ear, left ear: Secondary | ICD-10-CM | POA: Diagnosis not present

## 2024-06-13 DIAGNOSIS — H60332 Swimmer's ear, left ear: Secondary | ICD-10-CM | POA: Diagnosis not present

## 2024-06-24 DIAGNOSIS — Z6824 Body mass index (BMI) 24.0-24.9, adult: Secondary | ICD-10-CM | POA: Diagnosis not present

## 2024-06-24 DIAGNOSIS — R0602 Shortness of breath: Secondary | ICD-10-CM | POA: Diagnosis not present

## 2024-06-24 DIAGNOSIS — E7800 Pure hypercholesterolemia, unspecified: Secondary | ICD-10-CM | POA: Diagnosis not present

## 2024-06-24 DIAGNOSIS — Z974 Presence of external hearing-aid: Secondary | ICD-10-CM | POA: Diagnosis not present

## 2024-06-24 DIAGNOSIS — Z23 Encounter for immunization: Secondary | ICD-10-CM | POA: Diagnosis not present

## 2024-06-25 DIAGNOSIS — H903 Sensorineural hearing loss, bilateral: Secondary | ICD-10-CM | POA: Diagnosis not present

## 2024-07-08 DIAGNOSIS — M8580 Other specified disorders of bone density and structure, unspecified site: Secondary | ICD-10-CM | POA: Diagnosis not present

## 2024-07-08 DIAGNOSIS — E039 Hypothyroidism, unspecified: Secondary | ICD-10-CM | POA: Diagnosis not present

## 2024-07-08 DIAGNOSIS — E7800 Pure hypercholesterolemia, unspecified: Secondary | ICD-10-CM | POA: Diagnosis not present

## 2024-07-15 ENCOUNTER — Ambulatory Visit: Admitting: Obstetrics

## 2024-07-24 DIAGNOSIS — Z974 Presence of external hearing-aid: Secondary | ICD-10-CM | POA: Diagnosis not present

## 2024-07-24 DIAGNOSIS — Z85820 Personal history of malignant melanoma of skin: Secondary | ICD-10-CM | POA: Diagnosis not present

## 2024-07-24 DIAGNOSIS — Z96649 Presence of unspecified artificial hip joint: Secondary | ICD-10-CM | POA: Diagnosis not present

## 2024-07-24 DIAGNOSIS — E7800 Pure hypercholesterolemia, unspecified: Secondary | ICD-10-CM | POA: Diagnosis not present

## 2024-07-24 DIAGNOSIS — Z96653 Presence of artificial knee joint, bilateral: Secondary | ICD-10-CM | POA: Diagnosis not present

## 2024-07-24 DIAGNOSIS — R0602 Shortness of breath: Secondary | ICD-10-CM | POA: Diagnosis not present

## 2024-08-08 DIAGNOSIS — Z96659 Presence of unspecified artificial knee joint: Secondary | ICD-10-CM | POA: Diagnosis not present

## 2024-08-08 DIAGNOSIS — E7800 Pure hypercholesterolemia, unspecified: Secondary | ICD-10-CM | POA: Diagnosis not present

## 2024-08-08 DIAGNOSIS — Z96649 Presence of unspecified artificial hip joint: Secondary | ICD-10-CM | POA: Diagnosis not present

## 2024-09-07 DIAGNOSIS — E7800 Pure hypercholesterolemia, unspecified: Secondary | ICD-10-CM | POA: Diagnosis not present

## 2024-09-07 DIAGNOSIS — Z96659 Presence of unspecified artificial knee joint: Secondary | ICD-10-CM | POA: Diagnosis not present

## 2024-09-07 DIAGNOSIS — Z96649 Presence of unspecified artificial hip joint: Secondary | ICD-10-CM | POA: Diagnosis not present

## 2024-09-24 DIAGNOSIS — R202 Paresthesia of skin: Secondary | ICD-10-CM | POA: Diagnosis not present

## 2024-09-24 DIAGNOSIS — Z974 Presence of external hearing-aid: Secondary | ICD-10-CM | POA: Diagnosis not present

## 2024-09-24 DIAGNOSIS — Z96649 Presence of unspecified artificial hip joint: Secondary | ICD-10-CM | POA: Diagnosis not present

## 2024-09-24 DIAGNOSIS — E7800 Pure hypercholesterolemia, unspecified: Secondary | ICD-10-CM | POA: Diagnosis not present

## 2024-09-24 DIAGNOSIS — Z0000 Encounter for general adult medical examination without abnormal findings: Secondary | ICD-10-CM | POA: Diagnosis not present

## 2024-09-24 DIAGNOSIS — Z96652 Presence of left artificial knee joint: Secondary | ICD-10-CM | POA: Diagnosis not present

## 2024-09-24 DIAGNOSIS — Z96651 Presence of right artificial knee joint: Secondary | ICD-10-CM | POA: Diagnosis not present

## 2024-09-24 DIAGNOSIS — Z6824 Body mass index (BMI) 24.0-24.9, adult: Secondary | ICD-10-CM | POA: Diagnosis not present

## 2024-09-24 DIAGNOSIS — Z90710 Acquired absence of both cervix and uterus: Secondary | ICD-10-CM | POA: Diagnosis not present
